# Patient Record
Sex: Female | Born: 1950 | Race: White | Hispanic: No | Marital: Single | Smoking: Current some day smoker
Health system: Southern US, Community
[De-identification: ages and names within clinical notes are randomized; demographics above are authoritative.]

## PROBLEM LIST (undated history)

## (undated) DIAGNOSIS — F419 Anxiety disorder, unspecified: Secondary | ICD-10-CM

## (undated) DIAGNOSIS — N3281 Overactive bladder: Secondary | ICD-10-CM

## (undated) DIAGNOSIS — K219 Gastro-esophageal reflux disease without esophagitis: Secondary | ICD-10-CM

## (undated) DIAGNOSIS — K449 Diaphragmatic hernia without obstruction or gangrene: Secondary | ICD-10-CM

## (undated) DIAGNOSIS — M810 Age-related osteoporosis without current pathological fracture: Secondary | ICD-10-CM

## (undated) DIAGNOSIS — T7840XA Allergy, unspecified, initial encounter: Secondary | ICD-10-CM

## (undated) DIAGNOSIS — T8859XA Other complications of anesthesia, initial encounter: Secondary | ICD-10-CM

## (undated) DIAGNOSIS — F329 Major depressive disorder, single episode, unspecified: Secondary | ICD-10-CM

## (undated) DIAGNOSIS — M1990 Unspecified osteoarthritis, unspecified site: Secondary | ICD-10-CM

## (undated) DIAGNOSIS — R32 Unspecified urinary incontinence: Secondary | ICD-10-CM

## (undated) DIAGNOSIS — F32A Depression, unspecified: Secondary | ICD-10-CM

## (undated) DIAGNOSIS — G2581 Restless legs syndrome: Secondary | ICD-10-CM

## (undated) HISTORY — DX: Diaphragmatic hernia without obstruction or gangrene: K44.9

## (undated) HISTORY — DX: Unspecified urinary incontinence: R32

## (undated) HISTORY — DX: Gastro-esophageal reflux disease without esophagitis: K21.9

## (undated) HISTORY — DX: Anxiety disorder, unspecified: F41.9

## (undated) HISTORY — DX: Depression, unspecified: F32.A

## (undated) HISTORY — DX: Unspecified osteoarthritis, unspecified site: M19.90

## (undated) HISTORY — PX: BREAST BIOPSY: SHX20

## (undated) HISTORY — PX: EYE SURGERY: SHX253

## (undated) HISTORY — DX: Major depressive disorder, single episode, unspecified: F32.9

## (undated) HISTORY — DX: Allergy, unspecified, initial encounter: T78.40XA

## (undated) HISTORY — DX: Age-related osteoporosis without current pathological fracture: M81.0

## (undated) HISTORY — PX: BREAST EXCISIONAL BIOPSY: SUR124

## (undated) HISTORY — PX: CERVICAL BIOPSY: SHX590

## (undated) HISTORY — DX: Restless legs syndrome: G25.81

## (undated) HISTORY — DX: Overactive bladder: N32.81

---

## 1997-02-01 HISTORY — PX: OTHER SURGICAL HISTORY: SHX169

## 2006-09-25 ENCOUNTER — Encounter (INDEPENDENT_AMBULATORY_CARE_PROVIDER_SITE_OTHER): Payer: Self-pay | Admitting: Nurse Practitioner

## 2006-11-13 ENCOUNTER — Ambulatory Visit: Payer: Self-pay | Admitting: Psychiatry

## 2006-11-18 ENCOUNTER — Other Ambulatory Visit (HOSPITAL_COMMUNITY): Admission: RE | Admit: 2006-11-18 | Discharge: 2007-02-16 | Payer: Self-pay | Admitting: Psychiatry

## 2007-06-04 ENCOUNTER — Ambulatory Visit (HOSPITAL_COMMUNITY): Admission: RE | Admit: 2007-06-04 | Discharge: 2007-06-04 | Payer: Self-pay | Admitting: Internal Medicine

## 2007-06-04 ENCOUNTER — Ambulatory Visit: Payer: Self-pay | Admitting: Family Medicine

## 2007-07-01 ENCOUNTER — Ambulatory Visit: Payer: Self-pay | Admitting: Internal Medicine

## 2007-07-07 ENCOUNTER — Ambulatory Visit: Payer: Self-pay | Admitting: Internal Medicine

## 2007-10-11 ENCOUNTER — Encounter: Payer: Self-pay | Admitting: Internal Medicine

## 2007-10-11 ENCOUNTER — Ambulatory Visit: Payer: Self-pay | Admitting: Internal Medicine

## 2007-10-16 ENCOUNTER — Ambulatory Visit (HOSPITAL_COMMUNITY): Admission: RE | Admit: 2007-10-16 | Discharge: 2007-10-16 | Payer: Self-pay | Admitting: Family Medicine

## 2007-10-28 ENCOUNTER — Encounter: Admission: RE | Admit: 2007-10-28 | Discharge: 2007-10-28 | Payer: Self-pay | Admitting: Family Medicine

## 2007-11-05 ENCOUNTER — Ambulatory Visit: Payer: Self-pay | Admitting: Internal Medicine

## 2007-11-05 LAB — CONVERTED CEMR LAB
ALT: 17 units/L (ref 0–35)
AST: 15 units/L (ref 0–37)
BUN: 9 mg/dL (ref 6–23)
Basophils Absolute: 0.1 10*3/uL (ref 0.0–0.1)
Basophils Relative: 1 % (ref 0–1)
Creatinine, Ser: 0.82 mg/dL (ref 0.40–1.20)
Eosinophils Absolute: 0.1 10*3/uL (ref 0.0–0.7)
Eosinophils Relative: 3 % (ref 0–5)
HCT: 44.6 % (ref 36.0–46.0)
HDL: 80 mg/dL (ref >39)
Hemoglobin: 14.3 g/dL (ref 12.0–15.0)
MCHC: 32.1 g/dL (ref 30.0–36.0)
MCV: 92 fL (ref 78.0–100.0)
Monocytes Absolute: 0.3 10*3/uL (ref 0.1–1.0)
RDW: 12.6 % (ref 11.5–15.5)
TSH: 2.798 microintl units/mL (ref 0.350–4.50)
Total Bilirubin: 0.4 mg/dL (ref 0.3–1.2)
Total CHOL/HDL Ratio: 2.7
VLDL: 13 mg/dL (ref 0–40)

## 2007-12-02 ENCOUNTER — Ambulatory Visit: Payer: Self-pay | Admitting: Internal Medicine

## 2008-02-02 HISTORY — PX: BUNIONECTOMY: SHX129

## 2008-07-08 ENCOUNTER — Ambulatory Visit: Payer: Self-pay | Admitting: Internal Medicine

## 2008-11-10 ENCOUNTER — Ambulatory Visit: Payer: Self-pay | Admitting: Internal Medicine

## 2008-11-10 LAB — CONVERTED CEMR LAB
Alkaline Phosphatase: 76 units/L (ref 39–117)
Creatinine, Ser: 0.99 mg/dL (ref 0.40–1.20)
Glucose, Bld: 77 mg/dL (ref 70–99)
Sodium: 138 meq/L (ref 135–145)
Total Bilirubin: 0.3 mg/dL (ref 0.3–1.2)
Total Protein: 6.5 g/dL (ref 6.0–8.3)

## 2008-11-16 ENCOUNTER — Ambulatory Visit (HOSPITAL_COMMUNITY): Admission: RE | Admit: 2008-11-16 | Discharge: 2008-11-16 | Payer: Self-pay | Admitting: Internal Medicine

## 2008-11-29 ENCOUNTER — Encounter: Admission: RE | Admit: 2008-11-29 | Discharge: 2008-11-29 | Payer: Self-pay | Admitting: Internal Medicine

## 2008-12-03 ENCOUNTER — Ambulatory Visit: Payer: Self-pay | Admitting: Internal Medicine

## 2008-12-03 LAB — CONVERTED CEMR LAB
TSH: 4.048 microintl units/mL (ref 0.350–4.500)
Vit D, 25-Hydroxy: 22 ng/mL — ABNORMAL LOW (ref 30–89)

## 2008-12-14 ENCOUNTER — Encounter (INDEPENDENT_AMBULATORY_CARE_PROVIDER_SITE_OTHER): Payer: Self-pay | Admitting: Adult Health

## 2008-12-14 ENCOUNTER — Ambulatory Visit: Payer: Self-pay | Admitting: Internal Medicine

## 2008-12-14 LAB — CONVERTED CEMR LAB
Basophils Absolute: 0.1 10*3/uL (ref 0.0–0.1)
Eosinophils Relative: 2 % (ref 0–5)
HCT: 40.5 % (ref 36.0–46.0)
Hemoglobin: 13.2 g/dL (ref 12.0–15.0)
Lymphocytes Relative: 30 % (ref 12–46)
Lymphs Abs: 1.6 10*3/uL (ref 0.7–4.0)
Monocytes Absolute: 0.5 10*3/uL (ref 0.1–1.0)
Monocytes Relative: 9 % (ref 3–12)
Neutro Abs: 3.1 10*3/uL (ref 1.7–7.7)
RBC: 4.47 M/uL (ref 3.87–5.11)
RDW: 13.3 % (ref 11.5–15.5)

## 2009-01-06 ENCOUNTER — Encounter: Admission: RE | Admit: 2009-01-06 | Discharge: 2009-01-06 | Payer: Self-pay | Admitting: General Surgery

## 2009-01-06 ENCOUNTER — Ambulatory Visit (HOSPITAL_COMMUNITY): Admission: RE | Admit: 2009-01-06 | Discharge: 2009-01-06 | Payer: Self-pay | Admitting: General Surgery

## 2009-05-09 ENCOUNTER — Ambulatory Visit: Payer: Self-pay | Admitting: Internal Medicine

## 2009-11-17 ENCOUNTER — Encounter: Admission: RE | Admit: 2009-11-17 | Discharge: 2009-11-17 | Payer: Self-pay | Admitting: Internal Medicine

## 2010-03-19 LAB — BASIC METABOLIC PANEL
CO2: 27 mEq/L (ref 19–32)
Calcium: 9.1 mg/dL (ref 8.4–10.5)
Chloride: 106 mEq/L (ref 96–112)
GFR calc Af Amer: >60 mL/min (ref >60)
Glucose, Bld: 76 mg/dL (ref 70–99)
Potassium: 4.4 mEq/L (ref 3.5–5.1)
Sodium: 140 mEq/L (ref 135–145)

## 2010-03-19 LAB — CBC
HCT: 34.9 % — ABNORMAL LOW (ref 36.0–46.0)
Hemoglobin: 11.9 g/dL — ABNORMAL LOW (ref 12.0–15.0)
MCHC: 34.2 g/dL (ref 30.0–36.0)
MCV: 90.1 fL (ref 78.0–100.0)
RBC: 3.87 MIL/uL (ref 3.87–5.11)
RDW: 14.1 % (ref 11.5–15.5)

## 2010-03-19 LAB — DIFFERENTIAL
Basophils Absolute: 0.1 10*3/uL (ref 0.0–0.1)
Basophils Relative: 1 % (ref 0–1)
Eosinophils Relative: 2 % (ref 0–5)
Monocytes Absolute: 0.4 10*3/uL (ref 0.1–1.0)
Monocytes Relative: 9 % (ref 3–12)
Neutro Abs: 2.7 10*3/uL (ref 1.7–7.7)

## 2010-05-02 ENCOUNTER — Encounter (INDEPENDENT_AMBULATORY_CARE_PROVIDER_SITE_OTHER): Payer: Self-pay | Admitting: General Surgery

## 2010-06-19 ENCOUNTER — Other Ambulatory Visit: Payer: Self-pay | Admitting: Family Medicine

## 2010-10-11 ENCOUNTER — Other Ambulatory Visit (INDEPENDENT_AMBULATORY_CARE_PROVIDER_SITE_OTHER): Payer: Self-pay | Admitting: General Surgery

## 2010-10-11 DIAGNOSIS — Z1231 Encounter for screening mammogram for malignant neoplasm of breast: Secondary | ICD-10-CM

## 2010-11-06 ENCOUNTER — Ambulatory Visit (INDEPENDENT_AMBULATORY_CARE_PROVIDER_SITE_OTHER): Payer: Self-pay | Admitting: General Surgery

## 2010-11-20 ENCOUNTER — Ambulatory Visit (HOSPITAL_COMMUNITY)
Admission: RE | Admit: 2010-11-20 | Discharge: 2010-11-20 | Disposition: A | Discharge status: Home / Self Care | Payer: Medicare Other | Source: Ambulatory Visit | Attending: General Surgery | Admitting: General Surgery

## 2010-11-20 DIAGNOSIS — Z1231 Encounter for screening mammogram for malignant neoplasm of breast: Secondary | ICD-10-CM | POA: Insufficient documentation

## 2010-11-30 ENCOUNTER — Ambulatory Visit (INDEPENDENT_AMBULATORY_CARE_PROVIDER_SITE_OTHER): Payer: Self-pay | Admitting: General Surgery

## 2010-12-05 ENCOUNTER — Other Ambulatory Visit (HOSPITAL_COMMUNITY): Payer: Self-pay | Admitting: Family Medicine

## 2010-12-05 DIAGNOSIS — Z9223 Personal history of estrogen therapy: Secondary | ICD-10-CM

## 2010-12-05 DIAGNOSIS — Z780 Asymptomatic menopausal state: Secondary | ICD-10-CM

## 2010-12-14 ENCOUNTER — Ambulatory Visit (HOSPITAL_COMMUNITY)
Admission: RE | Admit: 2010-12-14 | Discharge: 2010-12-14 | Disposition: A | Discharge status: Home / Self Care | Payer: Medicare Other | Source: Ambulatory Visit | Attending: Family Medicine | Admitting: Family Medicine

## 2010-12-14 ENCOUNTER — Other Ambulatory Visit (HOSPITAL_COMMUNITY): Payer: Self-pay | Admitting: Family Medicine

## 2010-12-14 DIAGNOSIS — Z09 Encounter for follow-up examination after completed treatment for conditions other than malignant neoplasm: Secondary | ICD-10-CM | POA: Insufficient documentation

## 2010-12-14 DIAGNOSIS — Z9223 Personal history of estrogen therapy: Secondary | ICD-10-CM | POA: Insufficient documentation

## 2010-12-15 ENCOUNTER — Ambulatory Visit (INDEPENDENT_AMBULATORY_CARE_PROVIDER_SITE_OTHER): Payer: Medicare Other | Admitting: General Surgery

## 2010-12-15 DIAGNOSIS — R928 Other abnormal and inconclusive findings on diagnostic imaging of breast: Secondary | ICD-10-CM | POA: Insufficient documentation

## 2010-12-15 NOTE — Patient Instructions (Signed)
Follow up with me if mammograms are abnormal, otherwise, get yearly breast exam from primary care physician.

## 2010-12-15 NOTE — Assessment & Plan Note (Signed)
Follow up breast exam yearly with PCP Self breast exams Yearly mammo Follow up with me if mammo abnormal.
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
## 2010-12-18 ENCOUNTER — Ambulatory Visit (HOSPITAL_COMMUNITY)
Admission: RE | Admit: 2010-12-18 | Discharge: 2010-12-18 | Disposition: A | Discharge status: Home / Self Care | Payer: Medicare Other | Source: Ambulatory Visit | Attending: Family Medicine | Admitting: Family Medicine

## 2010-12-18 DIAGNOSIS — Z13820 Encounter for screening for osteoporosis: Secondary | ICD-10-CM | POA: Insufficient documentation

## 2010-12-18 DIAGNOSIS — Z780 Asymptomatic menopausal state: Secondary | ICD-10-CM

## 2011-10-23 ENCOUNTER — Other Ambulatory Visit (INDEPENDENT_AMBULATORY_CARE_PROVIDER_SITE_OTHER): Payer: Self-pay | Admitting: General Surgery

## 2011-10-23 DIAGNOSIS — Z1231 Encounter for screening mammogram for malignant neoplasm of breast: Secondary | ICD-10-CM

## 2011-11-21 ENCOUNTER — Ambulatory Visit (HOSPITAL_COMMUNITY)
Admission: RE | Admit: 2011-11-21 | Discharge: 2011-11-21 | Disposition: A | Discharge status: Home / Self Care | Payer: Medicare Other | Source: Ambulatory Visit | Attending: General Surgery | Admitting: General Surgery

## 2011-11-21 DIAGNOSIS — Z1231 Encounter for screening mammogram for malignant neoplasm of breast: Secondary | ICD-10-CM

## 2012-10-23 ENCOUNTER — Other Ambulatory Visit (HOSPITAL_COMMUNITY): Payer: Self-pay | Admitting: Nurse Practitioner

## 2012-10-23 DIAGNOSIS — Z1231 Encounter for screening mammogram for malignant neoplasm of breast: Secondary | ICD-10-CM

## 2012-11-24 ENCOUNTER — Ambulatory Visit (HOSPITAL_COMMUNITY)
Admission: RE | Admit: 2012-11-24 | Discharge: 2012-11-24 | Disposition: A | Discharge status: Home / Self Care | Payer: Medicare Other | Source: Ambulatory Visit | Attending: Nurse Practitioner | Admitting: Nurse Practitioner

## 2012-11-24 DIAGNOSIS — Z1231 Encounter for screening mammogram for malignant neoplasm of breast: Secondary | ICD-10-CM | POA: Insufficient documentation

## 2013-10-26 ENCOUNTER — Other Ambulatory Visit (HOSPITAL_COMMUNITY): Payer: Self-pay | Admitting: Nurse Practitioner

## 2013-10-26 DIAGNOSIS — Z1231 Encounter for screening mammogram for malignant neoplasm of breast: Secondary | ICD-10-CM

## 2013-11-25 ENCOUNTER — Ambulatory Visit (HOSPITAL_COMMUNITY)
Admission: RE | Admit: 2013-11-25 | Discharge: 2013-11-25 | Disposition: A | Discharge status: Home / Self Care | Payer: Medicare HMO | Source: Ambulatory Visit | Attending: Nurse Practitioner | Admitting: Nurse Practitioner

## 2013-11-25 DIAGNOSIS — Z1231 Encounter for screening mammogram for malignant neoplasm of breast: Secondary | ICD-10-CM | POA: Insufficient documentation

## 2014-09-28 ENCOUNTER — Emergency Department (HOSPITAL_COMMUNITY)
Admission: EM | Admit: 2014-09-28 | Discharge: 2014-09-28 | Disposition: A | Discharge status: Home / Self Care | Payer: No Typology Code available for payment source | Attending: Emergency Medicine | Admitting: Emergency Medicine

## 2014-09-28 ENCOUNTER — Encounter (HOSPITAL_COMMUNITY): Payer: Self-pay | Admitting: *Deleted

## 2014-09-28 DIAGNOSIS — S60211A Contusion of right wrist, initial encounter: Secondary | ICD-10-CM | POA: Insufficient documentation

## 2014-09-28 DIAGNOSIS — S00431A Contusion of right ear, initial encounter: Secondary | ICD-10-CM | POA: Insufficient documentation

## 2014-09-28 DIAGNOSIS — S5012XA Contusion of left forearm, initial encounter: Secondary | ICD-10-CM | POA: Insufficient documentation

## 2014-09-28 DIAGNOSIS — H9190 Unspecified hearing loss, unspecified ear: Secondary | ICD-10-CM | POA: Diagnosis not present

## 2014-09-28 DIAGNOSIS — Y998 Other external cause status: Secondary | ICD-10-CM | POA: Insufficient documentation

## 2014-09-28 DIAGNOSIS — Z8709 Personal history of other diseases of the respiratory system: Secondary | ICD-10-CM | POA: Diagnosis not present

## 2014-09-28 DIAGNOSIS — Z8719 Personal history of other diseases of the digestive system: Secondary | ICD-10-CM | POA: Insufficient documentation

## 2014-09-28 DIAGNOSIS — T148XXA Other injury of unspecified body region, initial encounter: Secondary | ICD-10-CM

## 2014-09-28 DIAGNOSIS — Y9389 Activity, other specified: Secondary | ICD-10-CM | POA: Diagnosis not present

## 2014-09-28 DIAGNOSIS — Y92009 Unspecified place in unspecified non-institutional (private) residence as the place of occurrence of the external cause: Secondary | ICD-10-CM | POA: Diagnosis not present

## 2014-09-28 DIAGNOSIS — S1081XA Abrasion of other specified part of neck, initial encounter: Secondary | ICD-10-CM | POA: Diagnosis not present

## 2014-09-28 DIAGNOSIS — F329 Major depressive disorder, single episode, unspecified: Secondary | ICD-10-CM | POA: Insufficient documentation

## 2014-09-28 DIAGNOSIS — Z79899 Other long term (current) drug therapy: Secondary | ICD-10-CM | POA: Insufficient documentation

## 2014-09-28 DIAGNOSIS — T7421XA Adult sexual abuse, confirmed, initial encounter: Secondary | ICD-10-CM | POA: Diagnosis not present

## 2014-09-28 DIAGNOSIS — F419 Anxiety disorder, unspecified: Secondary | ICD-10-CM | POA: Insufficient documentation

## 2014-09-28 DIAGNOSIS — S0083XA Contusion of other part of head, initial encounter: Secondary | ICD-10-CM | POA: Diagnosis not present

## 2014-09-28 MED ORDER — ULIPRISTAL ACETATE 30 MG PO TABS
ORAL_TABLET | ORAL | Status: AC
  Filled 2014-09-28: qty 1

## 2014-09-28 MED ORDER — PROMETHAZINE HCL 25 MG PO TABS
ORAL_TABLET | ORAL | Status: AC
  Administered 2014-09-28: 13:00:00
  Filled 2014-09-28: qty 3

## 2014-09-28 MED ORDER — CEFIXIME 400 MG PO CAPS
ORAL_CAPSULE | ORAL | Status: AC
  Administered 2014-09-28: 13:00:00
  Filled 2014-09-28: qty 1

## 2014-09-28 MED ORDER — METRONIDAZOLE 500 MG PO TABS
ORAL_TABLET | ORAL | Status: AC
  Administered 2014-09-28: 13:00:00
  Filled 2014-09-28: qty 4

## 2014-09-28 MED ORDER — AZITHROMYCIN 1 G PO PACK
PACK | ORAL | Status: AC
  Administered 2014-09-28: 13:00:00
  Filled 2014-09-28: qty 1
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
## 2014-09-28 NOTE — ED Notes (Signed)
Pt transferred off unit with sane.  D/C by SANE upstairs.

## 2014-09-28 NOTE — ED Notes (Addendum)
SANE RN at bedside.
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
## 2014-09-28 NOTE — ED Notes (Signed)
SANE nurse at bedside.

## 2014-11-02 ENCOUNTER — Other Ambulatory Visit: Payer: Self-pay

## 2014-11-02 DIAGNOSIS — Z1231 Encounter for screening mammogram for malignant neoplasm of breast: Secondary | ICD-10-CM

## 2014-12-01 ENCOUNTER — Ambulatory Visit
Admission: RE | Admit: 2014-12-01 | Discharge: 2014-12-01 | Disposition: A | Discharge status: Home / Self Care | Payer: Medicare HMO | Source: Ambulatory Visit

## 2014-12-01 DIAGNOSIS — Z1231 Encounter for screening mammogram for malignant neoplasm of breast: Secondary | ICD-10-CM

## 2015-01-05 DIAGNOSIS — F419 Anxiety disorder, unspecified: Secondary | ICD-10-CM | POA: Diagnosis not present

## 2015-02-02 DIAGNOSIS — F419 Anxiety disorder, unspecified: Secondary | ICD-10-CM | POA: Diagnosis not present

## 2015-02-07 DIAGNOSIS — Z21 Asymptomatic human immunodeficiency virus [HIV] infection status: Secondary | ICD-10-CM | POA: Diagnosis not present

## 2015-02-07 DIAGNOSIS — N898 Other specified noninflammatory disorders of vagina: Secondary | ICD-10-CM | POA: Diagnosis not present

## 2015-02-07 DIAGNOSIS — Z0000 Encounter for general adult medical examination without abnormal findings: Secondary | ICD-10-CM | POA: Diagnosis not present

## 2015-02-07 DIAGNOSIS — Z7251 High risk heterosexual behavior: Secondary | ICD-10-CM | POA: Diagnosis not present

## 2015-02-07 DIAGNOSIS — T7421XS Adult sexual abuse, confirmed, sequela: Secondary | ICD-10-CM | POA: Diagnosis not present

## 2015-03-02 DIAGNOSIS — F419 Anxiety disorder, unspecified: Secondary | ICD-10-CM | POA: Diagnosis not present

## 2015-03-08 DIAGNOSIS — F1020 Alcohol dependence, uncomplicated: Secondary | ICD-10-CM | POA: Diagnosis not present

## 2015-03-09 DIAGNOSIS — F419 Anxiety disorder, unspecified: Secondary | ICD-10-CM | POA: Diagnosis not present

## 2015-03-14 DIAGNOSIS — F419 Anxiety disorder, unspecified: Secondary | ICD-10-CM | POA: Diagnosis not present

## 2015-03-21 DIAGNOSIS — F419 Anxiety disorder, unspecified: Secondary | ICD-10-CM | POA: Diagnosis not present

## 2015-04-06 DIAGNOSIS — F419 Anxiety disorder, unspecified: Secondary | ICD-10-CM | POA: Diagnosis not present

## 2015-04-08 DIAGNOSIS — L282 Other prurigo: Secondary | ICD-10-CM | POA: Diagnosis not present

## 2015-04-08 DIAGNOSIS — F419 Anxiety disorder, unspecified: Secondary | ICD-10-CM | POA: Diagnosis not present

## 2015-04-08 DIAGNOSIS — K5900 Constipation, unspecified: Secondary | ICD-10-CM | POA: Diagnosis not present

## 2015-04-08 DIAGNOSIS — R21 Rash and other nonspecific skin eruption: Secondary | ICD-10-CM | POA: Diagnosis not present

## 2015-04-08 DIAGNOSIS — F329 Major depressive disorder, single episode, unspecified: Secondary | ICD-10-CM | POA: Diagnosis not present

## 2015-04-08 DIAGNOSIS — H6120 Impacted cerumen, unspecified ear: Secondary | ICD-10-CM | POA: Diagnosis not present

## 2015-04-25 DIAGNOSIS — F419 Anxiety disorder, unspecified: Secondary | ICD-10-CM | POA: Diagnosis not present

## 2015-05-04 DIAGNOSIS — F419 Anxiety disorder, unspecified: Secondary | ICD-10-CM | POA: Diagnosis not present

## 2015-05-09 DIAGNOSIS — F410 Panic disorder [episodic paroxysmal anxiety] without agoraphobia: Secondary | ICD-10-CM | POA: Diagnosis not present

## 2015-05-11 DIAGNOSIS — F419 Anxiety disorder, unspecified: Secondary | ICD-10-CM | POA: Diagnosis not present

## 2015-05-23 DIAGNOSIS — F419 Anxiety disorder, unspecified: Secondary | ICD-10-CM | POA: Diagnosis not present

## 2015-06-06 DIAGNOSIS — F419 Anxiety disorder, unspecified: Secondary | ICD-10-CM | POA: Diagnosis not present

## 2015-06-13 DIAGNOSIS — F419 Anxiety disorder, unspecified: Secondary | ICD-10-CM | POA: Diagnosis not present

## 2015-07-11 DIAGNOSIS — F419 Anxiety disorder, unspecified: Secondary | ICD-10-CM | POA: Diagnosis not present

## 2015-07-13 DIAGNOSIS — L508 Other urticaria: Secondary | ICD-10-CM | POA: Diagnosis not present

## 2015-08-01 DIAGNOSIS — F419 Anxiety disorder, unspecified: Secondary | ICD-10-CM | POA: Diagnosis not present

## 2015-09-08 DIAGNOSIS — M5431 Sciatica, right side: Secondary | ICD-10-CM | POA: Diagnosis not present

## 2015-09-13 ENCOUNTER — Telehealth (HOSPITAL_COMMUNITY): Payer: Self-pay

## 2015-09-13 NOTE — Telephone Encounter (Signed)
PT called and l/m to schedule a new pt appt with Dr. Sheran Spine. Called pt back and l/m to call his office at [place] as he's only here one day and week and the wait time is extensive. Left the # for [facility] on v/mail

## 2015-09-29 DIAGNOSIS — R5383 Other fatigue: Secondary | ICD-10-CM | POA: Diagnosis not present

## 2015-09-29 DIAGNOSIS — M5431 Sciatica, right side: Secondary | ICD-10-CM | POA: Diagnosis not present

## 2015-10-24 ENCOUNTER — Other Ambulatory Visit: Payer: Self-pay | Admitting: Internal Medicine

## 2015-10-24 DIAGNOSIS — Z1231 Encounter for screening mammogram for malignant neoplasm of breast: Secondary | ICD-10-CM

## 2015-10-31 DIAGNOSIS — F3342 Major depressive disorder, recurrent, in full remission: Secondary | ICD-10-CM | POA: Diagnosis not present

## 2015-11-10 DIAGNOSIS — M5431 Sciatica, right side: Secondary | ICD-10-CM | POA: Diagnosis not present

## 2015-11-10 DIAGNOSIS — R5383 Other fatigue: Secondary | ICD-10-CM | POA: Diagnosis not present

## 2015-11-17 DIAGNOSIS — M1611 Unilateral primary osteoarthritis, right hip: Secondary | ICD-10-CM | POA: Diagnosis not present

## 2015-11-17 DIAGNOSIS — M4186 Other forms of scoliosis, lumbar region: Secondary | ICD-10-CM | POA: Diagnosis not present

## 2015-11-17 DIAGNOSIS — K210 Gastro-esophageal reflux disease with esophagitis: Secondary | ICD-10-CM | POA: Diagnosis not present

## 2015-11-17 DIAGNOSIS — N3281 Overactive bladder: Secondary | ICD-10-CM | POA: Diagnosis not present

## 2015-11-17 DIAGNOSIS — M5431 Sciatica, right side: Secondary | ICD-10-CM | POA: Diagnosis not present

## 2015-11-17 DIAGNOSIS — G2581 Restless legs syndrome: Secondary | ICD-10-CM | POA: Diagnosis not present

## 2015-12-05 ENCOUNTER — Ambulatory Visit
Admission: RE | Admit: 2015-12-05 | Discharge: 2015-12-05 | Disposition: A | Discharge status: Home / Self Care | Payer: PPO | Source: Ambulatory Visit | Attending: Internal Medicine | Admitting: Internal Medicine

## 2015-12-05 DIAGNOSIS — Z1231 Encounter for screening mammogram for malignant neoplasm of breast: Secondary | ICD-10-CM

## 2015-12-29 DIAGNOSIS — F3342 Major depressive disorder, recurrent, in full remission: Secondary | ICD-10-CM | POA: Diagnosis not present

## 2016-01-11 ENCOUNTER — Encounter: Payer: Self-pay | Admitting: Gastroenterology

## 2016-02-16 ENCOUNTER — Ambulatory Visit: Payer: PPO | Admitting: Gastroenterology

## 2016-02-16 DIAGNOSIS — M5431 Sciatica, right side: Secondary | ICD-10-CM | POA: Diagnosis not present

## 2016-02-16 DIAGNOSIS — K210 Gastro-esophageal reflux disease with esophagitis: Secondary | ICD-10-CM | POA: Diagnosis not present

## 2016-02-16 DIAGNOSIS — F3342 Major depressive disorder, recurrent, in full remission: Secondary | ICD-10-CM | POA: Diagnosis not present

## 2016-02-22 ENCOUNTER — Encounter (INDEPENDENT_AMBULATORY_CARE_PROVIDER_SITE_OTHER): Payer: Self-pay

## 2016-02-22 ENCOUNTER — Ambulatory Visit (INDEPENDENT_AMBULATORY_CARE_PROVIDER_SITE_OTHER): Payer: PPO | Admitting: Gastroenterology

## 2016-02-22 ENCOUNTER — Encounter: Payer: Self-pay | Admitting: Gastroenterology

## 2016-02-22 VITALS — BP 112/80 | HR 88 | Ht 62.0 in | Wt 142.2 lb

## 2016-02-22 DIAGNOSIS — Z1211 Encounter for screening for malignant neoplasm of colon: Secondary | ICD-10-CM | POA: Diagnosis not present

## 2016-02-22 DIAGNOSIS — K5900 Constipation, unspecified: Secondary | ICD-10-CM | POA: Diagnosis not present

## 2016-02-22 DIAGNOSIS — K219 Gastro-esophageal reflux disease without esophagitis: Secondary | ICD-10-CM

## 2016-02-22 DIAGNOSIS — Z1212 Encounter for screening for malignant neoplasm of rectum: Secondary | ICD-10-CM

## 2016-02-22 MED ORDER — PLECANATIDE 3 MG PO TABS: 1.0000 | ORAL_TABLET | Freq: Every day | ORAL | 5 refills | Status: DC

## 2016-02-22 MED ORDER — NA SULFATE-K SULFATE-MG SULF 17.5-3.13-1.6 GM/177ML PO SOLN: 1.0000 | Freq: Once | ORAL | 0 refills | Status: AC

## 2016-02-22 NOTE — Patient Instructions (Signed)
Start on Trulance samples taking one tablet by mouth daily. A prescription has been sent to your pharmacy.   Continue your prune juice daily. Also continue Purlax daily.   You have been scheduled for a colonoscopy. Please follow written instructions given to you at your visit today.  Please pick up your prep supplies at the pharmacy within the next 1-3 days. If you use inhalers (even only as needed), please bring them with you on the day of your procedure. Your physician has requested that you go to www.startemmi.com and enter the access code given to you at your visit today. This web site gives a general overview about your procedure. However, you should still follow specific instructions given to you by our office regarding your preparation for the procedure.  Thank you for choosing me and [facility].  Pricilla Riffle. Dagoberto Ligas., MD., Marval Regal

## 2016-02-22 NOTE — Progress Notes (Signed)
History of Present Illness: This is a 66 year old female referred by Merrilee Seashore, MD for the evaluation of chronic constipation and colon cancer screening. Patient relates many years of difficulties with constipation and she states that she has incomplete evacuation. At times will have looser stools when taking several laxatives. She has tried Amitza and Linzess (2 strengths) without success. She has not previously had colonoscopy. She states she has done Hemoccults in the past and they have been negative. She has occasional heartburn occurring about once per month. She notes frequent abdominal bloating, flatulence and abdominal discomfort when constipated. These symptoms are relieved with bowel movements. Denies weight loss, abdominal pain,  diarrhea, change in stool caliber, melena, hematochezia, nausea, vomiting, dysphagia, chest pain.   No Known Allergies Outpatient Medications Prior to Visit  Medication Sig Dispense Refill  . busPIRone (BUSPAR) 10 MG tablet Take 20 mg by mouth 3 (three) times daily.  0  . Calcium Citrate-Vitamin D (CALCIUM + D PO) Take 1 tablet by mouth daily.    Marland Kitchen gabapentin (NEURONTIN) 300 MG capsule Take 300 mg by mouth 3 (three) times daily.    . Glucos-Chond-Hyal Ac-Ca Fructo (MOVE FREE JOINT HEALTH ADVANCE) TABS Take 1 tablet by mouth daily.    Marland Kitchen glucosamine-chondroitin 500-400 MG tablet Take 1 tablet by mouth daily.    Marland Kitchen loratadine (CLARITIN) 10 MG tablet Take 10 mg by mouth daily as needed for allergies.   3  . magnesium oxide (MAG-OX) 400 MG tablet Take 400 mg by mouth every evening.    . Misc Natural Products (OSTEO BI-FLEX JOINT SHIELD PO) Take 1 tablet by mouth daily.    . Multiple Vitamin (MULTIVITAMIN WITH MINERALS) TABS tablet Take 1 tablet by mouth daily.    . QUEtiapine (SEROQUEL) 50 MG tablet Take 25 mg by mouth at bedtime as needed (if waking up in the middle of the night take additional tablet).   2  . tolterodine (DETROL) 1 MG tablet Take 2 mg  by mouth every other day. On even days    . clonazePAM (KLONOPIN) 0.5 MG tablet Take 0.25 mg by mouth at bedtime.  2   No facility-administered medications prior to visit.    Past Medical History:  Diagnosis Date  . Anxiety   . Depression   . GERD (gastroesophageal reflux disease)   . Hearing loss   . Incontinence   . Overactive bladder   . Restless leg syndrome    Past Surgical History:  Procedure Laterality Date  . BUNIONECTOMY  02/2008   left foot. ALSO HAD DISLOCATED TOE & CROOKED TOE  . EYE SURGERY  age 65   straightened both eyes  . thumb surgery  02/1997   right   Social History   Social History  . Marital status: Single    Spouse name: N/A  . Number of children: N/A  . Years of education: N/A   Occupational History  . Retired    Social History Main Topics  . Smoking status: Never Smoker  . Smokeless tobacco: Never Used  . Alcohol use 1.0 oz/week    2 Standard drinks or equivalent per week     Comment: 2/DAY 4 DAYS A WEEK  . Drug use: No  . Sexual activity: Not Asked   Other Topics Concern  . None   Social History Narrative  . None   Family History  Problem Relation Age of Onset  . Arthritis Mother   . Stroke Mother   . Diabetes Father   .  Heart disease Father       Review of Systems: Pertinent positive and negative review of systems were noted in the above HPI section. All other review of systems were otherwise negative.   Physical Exam: General: Well developed, well nourished, no acute distress Head: Normocephalic and atraumatic Eyes:  sclerae anicteric, EOMI Ears: Normal auditory acuity Mouth: No deformity or lesions Neck: Supple, no masses or thyromegaly Lungs: Clear throughout to auscultation Heart: Regular rate and rhythm; no murmurs, rubs or bruits Abdomen: Soft, non tender and non distended. No masses, hepatosplenomegaly or hernias noted. Normal Bowel sounds Rectal: Deferred to colonoscopy Musculoskeletal: Symmetrical with no gross  deformities  Skin: No lesions on visible extremities Pulses:  Normal pulses noted Extremities: No clubbing, cyanosis, edema or deformities noted Neurological: Alert oriented x 4, grossly nonfocal Cervical Nodes:  No significant cervical adenopathy Inguinal Nodes: No significant inguinal adenopathy Psychological:  Alert and cooperative. Normal mood and affect  Assessment and Recommendations:  1. Constipation, chronic. Colorectal cancer screening. Prune juice daily. High fiber diet with at least 8 glasses of water daily. Begin Trulance 3 mg po daily. Miralax bid prn. Schedule colonoscopy. The risks (including bleeding, perforation, infection, missed lesions, medication reactions and possible hospitalization or surgery if complications occur), benefits, and alternatives to colonoscopy with possible biopsy and possible polypectomy were discussed with the patient and they consent to proceed.   2. GERD, mild. Antireflux measures and Tums when necessary. May use Zantac 150 milligrams daily when necessary Tums is not effective.  cc: Merrilee Seashore, MD [address] [place],  [address]

## 2016-02-23 ENCOUNTER — Telehealth: Payer: Self-pay

## 2016-02-23 DIAGNOSIS — R5383 Other fatigue: Secondary | ICD-10-CM | POA: Diagnosis not present

## 2016-02-23 DIAGNOSIS — M5431 Sciatica, right side: Secondary | ICD-10-CM | POA: Diagnosis not present

## 2016-02-23 DIAGNOSIS — K5901 Slow transit constipation: Secondary | ICD-10-CM | POA: Diagnosis not present

## 2016-02-23 DIAGNOSIS — K210 Gastro-esophageal reflux disease with esophagitis: Secondary | ICD-10-CM | POA: Diagnosis not present

## 2016-02-23 NOTE — Telephone Encounter (Signed)
Received fax from [employer] that Trulance is not on patient's formulary. Can we switch patient to alternative medication?

## 2016-02-23 NOTE — Telephone Encounter (Signed)
She has tried and failed Amitiza and Linzess-see note. Please contact Ruby Cola, the Trulance rep, to see if/how we can get approval by appealing to her insurance.

## 2016-02-24 NOTE — Telephone Encounter (Signed)
Faxed prior authorization for insurance company and waiting on a response.

## 2016-02-27 DIAGNOSIS — F3342 Major depressive disorder, recurrent, in full remission: Secondary | ICD-10-CM | POA: Diagnosis not present

## 2016-02-27 NOTE — Telephone Encounter (Signed)
Received fax from [employer] that coverage determination for Trulance has been approved from 02/24/16 until 12/31/2016.

## 2016-02-29 ENCOUNTER — Encounter: Payer: Self-pay | Admitting: Gastroenterology

## 2016-03-05 DIAGNOSIS — F3342 Major depressive disorder, recurrent, in full remission: Secondary | ICD-10-CM | POA: Diagnosis not present

## 2016-03-14 ENCOUNTER — Encounter: Payer: Self-pay | Admitting: Gastroenterology

## 2016-03-14 ENCOUNTER — Ambulatory Visit (AMBULATORY_SURGERY_CENTER): Payer: PPO | Admitting: Gastroenterology

## 2016-03-14 VITALS — BP 118/78 | HR 76 | Temp 97.1°F | Resp 18 | Ht 62.0 in | Wt 142.0 lb

## 2016-03-14 DIAGNOSIS — E669 Obesity, unspecified: Secondary | ICD-10-CM | POA: Diagnosis not present

## 2016-03-14 DIAGNOSIS — Z1211 Encounter for screening for malignant neoplasm of colon: Secondary | ICD-10-CM

## 2016-03-14 DIAGNOSIS — Z1212 Encounter for screening for malignant neoplasm of rectum: Secondary | ICD-10-CM | POA: Diagnosis not present

## 2016-03-14 DIAGNOSIS — F419 Anxiety disorder, unspecified: Secondary | ICD-10-CM | POA: Diagnosis not present

## 2016-03-14 DIAGNOSIS — K5900 Constipation, unspecified: Secondary | ICD-10-CM | POA: Diagnosis not present

## 2016-03-14 MED ORDER — SODIUM CHLORIDE 0.9 % IV SOLN: 500.0000 mL | INTRAVENOUS | Status: DC

## 2016-03-14 NOTE — Op Note (Signed)
[place] Patient Name: Kathleen Vasquez Procedure Date: 03/14/2016 1:01 PM MRN: [id number] Endoscopist: Ladene Artist , MD Age: 65 Referring MD:  Date of Birth: [birth year] Gender: Female Account #: [id number] Procedure:                Colonoscopy Indications:              Screening for colorectal malignant neoplasm Medicines:                Monitored Anesthesia Care Procedure:                Pre-Anesthesia Assessment:                           - Prior to the procedure, a History and Physical                            was performed, and patient medications and                            allergies were reviewed. The patient's tolerance of                            previous anesthesia was also reviewed. The risks                            and benefits of the procedure and the sedation                            options and risks were discussed with the patient.                            All questions were answered, and informed consent                            was obtained. Prior Anticoagulants: The patient has                            taken no previous anticoagulant or antiplatelet                            agents. ASA Grade Assessment: II - A patient with                            mild systemic disease. After reviewing the risks                            and benefits, the patient was deemed in                            satisfactory condition to undergo the procedure.                           After obtaining informed consent, the colonoscope  was passed under direct vision. Throughout the                            procedure, the patient's blood pressure, pulse, and                            oxygen saturations were monitored continuously. The                            Model [id number]-[id number] scope was introduced                            through the anus and advanced to the the cecum,                            identified by  appendiceal orifice and ileocecal                            valve. The ileocecal valve, appendiceal orifice,                            and rectum were photographed. The quality of the                            bowel preparation was good. The colonoscopy was                            performed without difficulty. The patient tolerated                            the procedure well. Scope In: 1:23:55 PM Scope Out: 1:36:48 PM Scope Withdrawal Time: 0 hours 9 minutes 13 seconds  Total Procedure Duration: 0 hours 12 minutes 53 seconds  Findings:                 The perianal and digital rectal examinations were                            normal.                           A few small-mouthed diverticula were found in the                            sigmoid colon. There was no evidence of                            diverticular bleeding.                           The exam was otherwise without abnormality on                            direct and retroflexion views. Complications:            No immediate complications. Estimated blood  loss:                            None. Estimated Blood Loss:     Estimated blood loss: none. Impression:               - Mild diverticulosis in the sigmoid colon. There                            was no evidence of diverticular bleeding.                           - The examination was otherwise normal on direct                            and retroflexion views.                           - No specimens collected. Recommendation:           - Repeat colonoscopy in 10 years for screening                            purposes.                           - Patient has a contact number available for                            emergencies. The signs and symptoms of potential                            delayed complications were discussed with the                            patient. Return to normal activities tomorrow.                            Written discharge  instructions were provided to the                            patient.                           - Resume previous diet.                           - Continue present medications. Ladene Artist, MD 03/14/2016 1:39:09 PM This report has been signed electronically.

## 2016-03-14 NOTE — Progress Notes (Signed)
Report to PACU, RN, vss, BBS= Clear.  

## 2016-03-14 NOTE — Progress Notes (Signed)
No problems noted in the recovery room. maw 

## 2016-03-14 NOTE — Progress Notes (Signed)
No home oxygen used or hx of sleep apnea

## 2016-03-14 NOTE — Patient Instructions (Signed)
YOU HAD AN ENDOSCOPIC PROCEDURE TODAY AT [facility]:   Refer to the procedure report that was given to you for any specific questions about what was found during the examination.  If the procedure report does not answer your questions, please call your gastroenterologist to clarify.  If you requested that your care partner not be given the details of your procedure findings, then the procedure report has been included in a sealed envelope for you to review at your convenience later.  YOU SHOULD EXPECT: Some feelings of bloating in the abdomen. Passage of more gas than usual.  Walking can help get rid of the air that was put into your GI tract during the procedure and reduce the bloating. If you had a lower endoscopy (such as a colonoscopy or flexible sigmoidoscopy) you may notice spotting of blood in your stool or on the toilet paper. If you underwent a bowel prep for your procedure, you may not have a normal bowel movement for a few days.  Please Note:  You might notice some irritation and congestion in your nose or some drainage.  This is from the oxygen used during your procedure.  There is no need for concern and it should clear up in a day or so.  SYMPTOMS TO REPORT IMMEDIATELY:   Following lower endoscopy (colonoscopy or flexible sigmoidoscopy):  Excessive amounts of blood in the stool  Significant tenderness or worsening of abdominal pains  Swelling of the abdomen that is new, acute  Fever of 100F or higher   For urgent or emergent issues, a gastroenterologist can be reached at any hour by calling [phone].   DIET:  We do recommend a small meal at first, but then you may proceed to your regular diet.  Drink plenty of fluids but you should avoid alcoholic beverages for 24 hours.  ACTIVITY:  You should plan to take it easy for the rest of today and you should NOT DRIVE or use heavy machinery until tomorrow (because of the sedation medicines used during the test).     FOLLOW UP: Our staff will call the number listed on your records the next business day following your procedure to check on you and address any questions or concerns that you may have regarding the information given to you following your procedure. If we do not reach you, we will leave a message.  However, if you are feeling well and you are not experiencing any problems, there is no need to return our call.  We will assume that you have returned to your regular daily activities without incident.  If any biopsies were taken you will be contacted by phone or by letter within the next 1-3 weeks.  Please call us at [phone] if you have not heard about the biopsies in 3 weeks.    SIGNATURES/CONFIDENTIALITY: You and/or your care partner have signed paperwork which will be entered into your electronic medical record.  These signatures attest to the fact that that the information above on your After Visit Summary has been reviewed and is understood.  Full responsibility of the confidentiality of this discharge information lies with you and/or your care-partner.    Handouts were given to your care partner on diverticulosis and a high fiber diet with liberal fluid intake. You may resume your current medications today. Please call if any questions or concerns.

## 2016-03-15 ENCOUNTER — Telehealth: Payer: Self-pay | Admitting: *Deleted

## 2016-03-15 NOTE — Telephone Encounter (Signed)
  Follow up Call-  Call back number 03/14/2016  Post procedure Call Back phone  # [phone]  Permission to leave phone message Yes  Some recent data might be hidden     Patient questions:  Do you have a fever, pain , or abdominal swelling? No. Pain Score  0 *  Have you tolerated food without any problems? Yes.    Have you been able to return to your normal activities? Yes.    Do you have any questions about your discharge instructions: Diet   No. Medications  No. Follow up visit  No.  Do you have questions or concerns about your Care? No.  Actions: * If pain score is 4 or above: No action needed, pain <4.

## 2016-06-04 DIAGNOSIS — F3342 Major depressive disorder, recurrent, in full remission: Secondary | ICD-10-CM | POA: Diagnosis not present

## 2016-06-21 ENCOUNTER — Telehealth: Payer: Self-pay | Admitting: Gastroenterology

## 2016-06-21 NOTE — Telephone Encounter (Signed)
Patient would like to come in and discuss her worsening reflux with Dr. Fuller Plan.  She has changed from constipation to now loose stools.  She is scheduled for an appt on 07/25/16

## 2016-07-02 DIAGNOSIS — H43813 Vitreous degeneration, bilateral: Secondary | ICD-10-CM | POA: Diagnosis not present

## 2016-07-02 DIAGNOSIS — H4423 Degenerative myopia, bilateral: Secondary | ICD-10-CM | POA: Diagnosis not present

## 2016-07-03 DIAGNOSIS — B880 Other acariasis: Secondary | ICD-10-CM | POA: Diagnosis not present

## 2016-07-23 DIAGNOSIS — F3342 Major depressive disorder, recurrent, in full remission: Secondary | ICD-10-CM | POA: Diagnosis not present

## 2016-07-25 ENCOUNTER — Encounter (INDEPENDENT_AMBULATORY_CARE_PROVIDER_SITE_OTHER): Payer: Self-pay

## 2016-07-25 ENCOUNTER — Ambulatory Visit (INDEPENDENT_AMBULATORY_CARE_PROVIDER_SITE_OTHER): Payer: PPO | Admitting: Gastroenterology

## 2016-07-25 ENCOUNTER — Encounter: Payer: Self-pay | Admitting: Gastroenterology

## 2016-07-25 VITALS — BP 100/80 | HR 76 | Ht 61.5 in | Wt 146.1 lb

## 2016-07-25 DIAGNOSIS — K219 Gastro-esophageal reflux disease without esophagitis: Secondary | ICD-10-CM | POA: Diagnosis not present

## 2016-07-25 DIAGNOSIS — R1310 Dysphagia, unspecified: Secondary | ICD-10-CM

## 2016-07-25 MED ORDER — RANITIDINE HCL 300 MG PO CAPS
300.0000 mg | ORAL_CAPSULE | Freq: Every evening | ORAL | 11 refills | Status: DC
Start: 2016-07-25 — End: 2016-09-19

## 2016-07-25 NOTE — Progress Notes (Signed)
    History of Present Illness: This is a 66 year old female complaining of worsening problems with reflux and a few episodes of dysphagia. She states she has had 2 or 3 episodes over the past 6-8 months where it has been difficult to swallow a large pill or solid foods such as meat. Symptoms have been sporadic and do not appear to be increasing in frequency or severity. She has frequent nocturnal regurgitation and reflux symptoms. She is been taking Tums when necessary.   Colonoscopy 03/2016 - Mild diverticulosis in the sigmoid colon. There was no evidence of diverticular bleeding. - The examination was otherwise normal on direct and retroflexion views. - No specimens collected.  Current Medications, Allergies, Past Medical History, Past Surgical History, Family History and Social History were reviewed in [employer] record.  Physical Exam: General: Well developed, well nourished, no acute distress Head: Normocephalic and atraumatic Eyes:  sclerae anicteric, EOMI Ears: Normal auditory acuity Mouth: No deformity or lesions Lungs: Clear throughout to auscultation Heart: Regular rate and rhythm; no murmurs, rubs or bruits Abdomen: Soft, non tender and non distended. No masses, hepatosplenomegaly or hernias noted. Normal Bowel sounds Musculoskeletal: Symmetrical with no gross deformities  Pulses:  Normal pulses noted Extremities: No clubbing, cyanosis, edema or deformities noted Neurological: Alert oriented x 4, grossly nonfocal Psychological:  Alert and cooperative. Normal mood and affect  Assessment and Recommendations:  1. GERD and dysphagia. Follow standard antireflux measures. Begin the use of 4 inch bed blocks. Begin ranitidine 300 mg every evening. Continue Tums when necessary. Schedule barium esophagram. Schedule EGD. The risks (including bleeding, perforation, infection, missed lesions, medication reactions and possible hospitalization or surgery if  complications occur), benefits, and alternatives to endoscopy with possible biopsy and possible dilation were discussed with the patient and they consent to proceed.

## 2016-07-25 NOTE — Patient Instructions (Addendum)
You have been scheduled for an endoscopy. Please follow written instructions given to you at your visit today. If you use inhalers (even only as needed), please bring them with you on the day of your procedure. Your physician has requested that you go to www.startemmi.com and enter the access code given to you at your visit today. This web site gives a general overview about your procedure. However, you should still follow specific instructions given to you by our office regarding your preparation for the procedure.  ________________________________________________________________________   You have been scheduled for a Barium Esophogram at [facility] (1st floor of the hospital) on 08-13-16 at 10:30am. Please arrive 15 minutes prior to your appointment for registration. Make certain not to have anything to eat or drink 6 hours prior to your test. If you need to reschedule for any reason, please contact radiology at [phone] to do so. __________________________________________________________________ A barium swallow is an examination that concentrates on views of the esophagus. This tends to be a double contrast exam (barium and two liquids which, when combined, create a gas to distend the wall of the oesophagus) or single contrast (non-ionic iodine based). The study is usually tailored to your symptoms so a good history is essential. Attention is paid during the study to the form, structure and configuration of the esophagus, looking for functional disorders (such as aspiration, dysphagia, achalasia, motility and reflux) EXAMINATION You may be asked to change into a gown, depending on the type of swallow being performed. A radiologist and radiographer will perform the procedure. The radiologist will advise you of the type of contrast selected for your procedure and direct you during the exam. You will be asked to stand, sit or lie in several different positions and to hold a small amount  of fluid in your mouth before being asked to swallow while the imaging is performed .In some instances you may be asked to swallow barium coated marshmallows to assess the motility of a solid food bolus. The exam can be recorded as a digital or video fluoroscopy procedure. POST PROCEDURE It will take 1-2 days for the barium to pass through your system. To facilitate this, it is important, unless otherwise directed, to increase your fluids for the next 24-48hrs and to resume your normal diet.  This test typically takes about 30 minutes to perform. ____________________________________________________________________   We have sent the following medications to your pharmacy for you to pick up at your convenience: Ranitidine  Patient advised to avoid spicy, acidic, citrus, chocolate, mints, fruit and fruit juices.  Limit the intake of caffeine, alcohol and Soda.  Don't exercise too soon after eating.  Don't lie down within 3-4 hours of eating.  Elevate the head of your bed with 4 inch bed blocks.    Normal BMI (Body Mass Index- based on height and weight) is between 23 and 30. Your BMI today is Body mass index is 27.16 kg/m. Marland Kitchen Please consider follow up  regarding your BMI with your Primary Care Provider.  Thank you for choosing me and [facility].  Pricilla Riffle. Dagoberto Ligas., MD., Marval Regal

## 2016-08-07 DIAGNOSIS — Z780 Asymptomatic menopausal state: Secondary | ICD-10-CM | POA: Diagnosis not present

## 2016-08-07 DIAGNOSIS — M5431 Sciatica, right side: Secondary | ICD-10-CM | POA: Diagnosis not present

## 2016-08-07 DIAGNOSIS — Z0000 Encounter for general adult medical examination without abnormal findings: Secondary | ICD-10-CM | POA: Diagnosis not present

## 2016-08-07 DIAGNOSIS — R5383 Other fatigue: Secondary | ICD-10-CM | POA: Diagnosis not present

## 2016-08-13 ENCOUNTER — Ambulatory Visit (HOSPITAL_COMMUNITY)
Admission: RE | Admit: 2016-08-13 | Discharge: 2016-08-13 | Disposition: A | Discharge status: Home / Self Care | Payer: PPO | Source: Ambulatory Visit | Attending: Gastroenterology | Admitting: Gastroenterology

## 2016-08-13 DIAGNOSIS — K449 Diaphragmatic hernia without obstruction or gangrene: Secondary | ICD-10-CM | POA: Insufficient documentation

## 2016-08-13 DIAGNOSIS — R1310 Dysphagia, unspecified: Secondary | ICD-10-CM | POA: Diagnosis not present

## 2016-08-13 DIAGNOSIS — K219 Gastro-esophageal reflux disease without esophagitis: Secondary | ICD-10-CM

## 2016-08-14 DIAGNOSIS — R5383 Other fatigue: Secondary | ICD-10-CM | POA: Diagnosis not present

## 2016-08-14 DIAGNOSIS — R002 Palpitations: Secondary | ICD-10-CM | POA: Diagnosis not present

## 2016-08-14 DIAGNOSIS — K210 Gastro-esophageal reflux disease with esophagitis: Secondary | ICD-10-CM | POA: Diagnosis not present

## 2016-08-14 DIAGNOSIS — K222 Esophageal obstruction: Secondary | ICD-10-CM | POA: Diagnosis not present

## 2016-08-14 DIAGNOSIS — M810 Age-related osteoporosis without current pathological fracture: Secondary | ICD-10-CM | POA: Diagnosis not present

## 2016-08-14 DIAGNOSIS — Z23 Encounter for immunization: Secondary | ICD-10-CM | POA: Diagnosis not present

## 2016-08-14 DIAGNOSIS — K449 Diaphragmatic hernia without obstruction or gangrene: Secondary | ICD-10-CM | POA: Diagnosis not present

## 2016-09-05 ENCOUNTER — Encounter: Payer: Self-pay | Admitting: Gastroenterology

## 2016-09-06 DIAGNOSIS — F3342 Major depressive disorder, recurrent, in full remission: Secondary | ICD-10-CM | POA: Diagnosis not present

## 2016-09-19 ENCOUNTER — Ambulatory Visit (AMBULATORY_SURGERY_CENTER): Payer: PPO | Admitting: Gastroenterology

## 2016-09-19 ENCOUNTER — Encounter: Payer: Self-pay | Admitting: *Deleted

## 2016-09-19 ENCOUNTER — Encounter: Payer: Self-pay | Admitting: Gastroenterology

## 2016-09-19 VITALS — BP 125/84 | HR 88 | Temp 98.0°F | Resp 22 | Ht 61.0 in | Wt 146.0 lb

## 2016-09-19 DIAGNOSIS — K220 Achalasia of cardia: Secondary | ICD-10-CM | POA: Diagnosis not present

## 2016-09-19 DIAGNOSIS — R1310 Dysphagia, unspecified: Secondary | ICD-10-CM | POA: Diagnosis not present

## 2016-09-19 DIAGNOSIS — K222 Esophageal obstruction: Secondary | ICD-10-CM | POA: Diagnosis not present

## 2016-09-19 DIAGNOSIS — K449 Diaphragmatic hernia without obstruction or gangrene: Secondary | ICD-10-CM | POA: Diagnosis not present

## 2016-09-19 DIAGNOSIS — K2100 Gastro-esophageal reflux disease with esophagitis, without bleeding: Secondary | ICD-10-CM

## 2016-09-19 DIAGNOSIS — R1319 Other dysphagia: Secondary | ICD-10-CM

## 2016-09-19 DIAGNOSIS — R933 Abnormal findings on diagnostic imaging of other parts of digestive tract: Secondary | ICD-10-CM | POA: Diagnosis not present

## 2016-09-19 DIAGNOSIS — K219 Gastro-esophageal reflux disease without esophagitis: Secondary | ICD-10-CM | POA: Diagnosis not present

## 2016-09-19 MED ORDER — PANTOPRAZOLE SODIUM 40 MG PO TBEC: DELAYED_RELEASE_TABLET | ORAL | 3 refills | Status: DC

## 2016-09-19 MED ORDER — SODIUM CHLORIDE 0.9 % IV SOLN: 500.0000 mL | INTRAVENOUS | Status: DC

## 2016-09-19 NOTE — Progress Notes (Signed)
Pt's states no medical or surgical changes since previsit or office visit. 

## 2016-09-19 NOTE — Op Note (Signed)
[facility] Patient Name: Kathleen Vasquez Procedure Date: 09/19/2016 10:04 AM MRN: [id number] Endoscopist: Ladene Artist , MD Age: 65 Referring MD:  Date of Birth: February 26, 1950 Gender: Female Account #: [id number] Procedure:                Upper GI endoscopy Indications:              Dysphagia, Gastro-esophageal reflux disease,                            Abnormal cine-esophagram Medicines:                Monitored Anesthesia Care Procedure:                Pre-Anesthesia Assessment:                           - Prior to the procedure, a History and Physical                            was performed, and patient medications and                            allergies were reviewed. The patient's tolerance of                            previous anesthesia was also reviewed. The risks                            and benefits of the procedure and the sedation                            options and risks were discussed with the patient.                            All questions were answered, and informed consent                            was obtained. Prior Anticoagulants: The patient has                            taken no previous anticoagulant or antiplatelet                            agents. ASA Grade Assessment: II - A patient with                            mild systemic disease. After reviewing the risks                            and benefits, the patient was deemed in                            satisfactory condition to undergo the procedure.  After obtaining informed consent, the endoscope was                            passed under direct vision. Throughout the                            procedure, the patient's blood pressure, pulse, and                            oxygen saturations were monitored continuously. The                            Model GIF-HQ190 [id number]) scope was introduced                            through the mouth, and advanced  to the second part                            of duodenum. The upper GI endoscopy was                            accomplished without difficulty. The patient                            tolerated the procedure well. Scope In: Scope Out: Findings:                 LA Grade C (one or more mucosal breaks continuous                            between tops of 2 or more mucosal folds, less than                            75% circumference) esophagitis with bleeding was                            found in the distal esophagus.                           One moderate benign-appearing, intrinsic stenosis                            was found 32 cm from the incisors. This measured                            1.1 cm (inner diameter) and was traversed. A                            guidewire was placed and the scope was withdrawn.                            Dilations were performed with Savary dilators with  mild resistance at 13 mm, 14 mm, 15 mm. Estimated                            blood loss was minimal.                           The exam of the esophagus was otherwise normal.                           A small hiatal hernia was present.                           The exam of the stomach was otherwise normal.                           The duodenal bulb and second portion of the                            duodenum were normal. Complications:            No immediate complications. Estimated Blood Loss:     Estimated blood loss was minimal. Impression:               - LA Grade C reflux esophagitis.                           - Benign-appearing esophageal stenosis. Dilated.                           - Small hiatal hernia.                           - Normal duodenal bulb and second portion of the                            duodenum.                           - No specimens collected. Recommendation:           - Patient has a contact number available for                             emergencies. The signs and symptoms of potential                            delayed complications were discussed with the                            patient. Return to normal activities tomorrow.                            Written discharge instructions were provided to the                            patient.                           -  Clear liquid diet for 2 hours, then advance as                            tolerated to soft diet today. Advance to prior diet                            as tolatered.                           - Antireflux measures long term.                           - Continue present medications.                           - Protonix (pantoprazole) 40 mg PO BID for 2                            months, then pantoprazole 40 mg qam long term.                           - DC Zantac                           - Repeat EGD in 2-3 months Ladene Artist, MD 09/19/2016 10:35:11 AM This report has been signed electronically.

## 2016-09-19 NOTE — Patient Instructions (Addendum)
Impression/recommendations:  Hiatal Hernia (handout given) Stricture (handout given) Dilation diet (handout given)  Pantoprazole 40 mg twice per day for 2 months and then each morning for long term use.  Repeat Endoscopy scheduled for   YOU HAD AN ENDOSCOPIC PROCEDURE TODAY AT [facility]:   Refer to the procedure report that was given to you for any specific questions about what was found during the examination.  If the procedure report does not answer your questions, please call your gastroenterologist to clarify.  If you requested that your care partner not be given the details of your procedure findings, then the procedure report has been included in a sealed envelope for you to review at your convenience later.  YOU SHOULD EXPECT: Some feelings of bloating in the abdomen. Passage of more gas than usual.  Walking can help get rid of the air that was put into your GI tract during the procedure and reduce the bloating. If you had a lower endoscopy (such as a colonoscopy or flexible sigmoidoscopy) you may notice spotting of blood in your stool or on the toilet paper. If you underwent a bowel prep for your procedure, you may not have a normal bowel movement for a few days.  Please Note:  You might notice some irritation and congestion in your nose or some drainage.  This is from the oxygen used during your procedure.  There is no need for concern and it should clear up in a day or so.  SYMPTOMS TO REPORT IMMEDIATELY:   Following upper endoscopy (EGD)  Vomiting of blood or coffee ground material  New chest pain or pain under the shoulder blades  Painful or persistently difficult swallowing  New shortness of breath  Fever of 100F or higher  Black, tarry-looking stools  For urgent or emergent issues, a gastroenterologist can be reached at any hour by calling [phone].   DIET:  We do recommend a small meal at first, but then you may proceed to your regular diet.   Drink plenty of fluids but you should avoid alcoholic beverages for 24 hours.  ACTIVITY:  You should plan to take it easy for the rest of today and you should NOT DRIVE or use heavy machinery until tomorrow (because of the sedation medicines used during the test).    FOLLOW UP: Our staff will call the number listed on your records the next business day following your procedure to check on you and address any questions or concerns that you may have regarding the information given to you following your procedure. If we do not reach you, we will leave a message.  However, if you are feeling well and you are not experiencing any problems, there is no need to return our call.  We will assume that you have returned to your regular daily activities without incident.  If any biopsies were taken you will be contacted by phone or by letter within the next 1-3 weeks.  Please call us at [phone] if you have not heard about the biopsies in 3 weeks.    SIGNATURES/CONFIDENTIALITY: You and/or your care partner have signed paperwork which will be entered into your electronic medical record.  These signatures attest to the fact that that the information above on your After Visit Summary has been reviewed and is understood.  Full responsibility of the confidentiality of this discharge information lies with you and/or your care-partner.

## 2016-09-19 NOTE — Progress Notes (Signed)
Report given to PACU, vss 

## 2016-09-19 NOTE — Progress Notes (Signed)
Called to room to assist during endoscopic procedure.  Patient ID and intended procedure confirmed with present staff. Received instructions for my participation in the procedure from the performing physician.  

## 2016-09-20 ENCOUNTER — Telehealth: Payer: Self-pay

## 2016-09-20 ENCOUNTER — Telehealth: Payer: Self-pay | Admitting: Gastroenterology

## 2016-09-20 NOTE — Telephone Encounter (Signed)
Left message on answering machine. 

## 2016-09-20 NOTE — Telephone Encounter (Signed)
All questions answered.  She spoke with the [facility] staff earlier and addressed some of her other questions.  She will call back if she has any additional questions or concerns.

## 2016-09-20 NOTE — Telephone Encounter (Signed)
  Follow up Call-  Call back number 09/19/2016 03/14/2016  Post procedure Call Back phone  # [phone]  Permission to leave phone message Yes Yes  Some recent data might be hidden     Patient questions:  Do you have a fever, pain , or abdominal swelling? No. Pain Score  0 *  Have you tolerated food without any problems? Yes.    Have you been able to return to your normal activities? Yes.    Do you have any questions about your discharge instructions: Diet   No. Medications  No. Follow up visit  No.  Do you have questions or concerns about your Care? No.  Actions: * If pain score is 4 or above: No action needed, pain <4.

## 2016-10-30 ENCOUNTER — Other Ambulatory Visit: Payer: Self-pay | Admitting: Internal Medicine

## 2016-10-30 DIAGNOSIS — Z1231 Encounter for screening mammogram for malignant neoplasm of breast: Secondary | ICD-10-CM

## 2016-11-07 ENCOUNTER — Encounter: Payer: Self-pay | Admitting: Gastroenterology

## 2016-11-14 DIAGNOSIS — M810 Age-related osteoporosis without current pathological fracture: Secondary | ICD-10-CM | POA: Diagnosis not present

## 2016-11-19 ENCOUNTER — Ambulatory Visit (AMBULATORY_SURGERY_CENTER): Payer: PPO | Admitting: Gastroenterology

## 2016-11-19 ENCOUNTER — Encounter: Payer: Self-pay | Admitting: Gastroenterology

## 2016-11-19 VITALS — BP 125/84 | HR 96 | Temp 97.8°F | Resp 22 | Ht 61.5 in | Wt 146.0 lb

## 2016-11-19 DIAGNOSIS — K209 Esophagitis, unspecified without bleeding: Secondary | ICD-10-CM

## 2016-11-19 DIAGNOSIS — K229 Disease of esophagus, unspecified: Secondary | ICD-10-CM

## 2016-11-19 DIAGNOSIS — K222 Esophageal obstruction: Secondary | ICD-10-CM | POA: Diagnosis not present

## 2016-11-19 DIAGNOSIS — R933 Abnormal findings on diagnostic imaging of other parts of digestive tract: Secondary | ICD-10-CM

## 2016-11-19 DIAGNOSIS — K2100 Gastro-esophageal reflux disease with esophagitis, without bleeding: Secondary | ICD-10-CM

## 2016-11-19 DIAGNOSIS — K317 Polyp of stomach and duodenum: Secondary | ICD-10-CM | POA: Diagnosis not present

## 2016-11-19 DIAGNOSIS — R1310 Dysphagia, unspecified: Secondary | ICD-10-CM

## 2016-11-19 DIAGNOSIS — K219 Gastro-esophageal reflux disease without esophagitis: Secondary | ICD-10-CM | POA: Diagnosis not present

## 2016-11-19 DIAGNOSIS — F419 Anxiety disorder, unspecified: Secondary | ICD-10-CM | POA: Diagnosis not present

## 2016-11-19 DIAGNOSIS — K2270 Barrett's esophagus without dysplasia: Secondary | ICD-10-CM | POA: Diagnosis not present

## 2016-11-19 DIAGNOSIS — R1319 Other dysphagia: Secondary | ICD-10-CM

## 2016-11-19 MED ORDER — SODIUM CHLORIDE 0.9 % IV SOLN: 500.0000 mL | INTRAVENOUS | Status: DC

## 2016-11-19 MED ORDER — PANTOPRAZOLE SODIUM 40 MG PO TBEC: DELAYED_RELEASE_TABLET | ORAL | 11 refills | Status: DC

## 2016-11-19 NOTE — Patient Instructions (Signed)
YOU HAD AN ENDOSCOPIC PROCEDURE TODAY AT [facility]:   Refer to the procedure report that was given to you for any specific questions about what was found during the examination.  If the procedure report does not answer your questions, please call your gastroenterologist to clarify.  If you requested that your care partner not be given the details of your procedure findings, then the procedure report has been included in a sealed envelope for you to review at your convenience later.  YOU SHOULD EXPECT: Some feelings of bloating in the abdomen. Passage of more gas than usual.  Walking can help get rid of the air that was put into your GI tract during the procedure and reduce the bloating. If you had a lower endoscopy (such as a colonoscopy or flexible sigmoidoscopy) you may notice spotting of blood in your stool or on the toilet paper. If you underwent a bowel prep for your procedure, you may not have a normal bowel movement for a few days.  Please Note:  You might notice some irritation and congestion in your nose or some drainage.  This is from the oxygen used during your procedure.  There is no need for concern and it should clear up in a day or so.  SYMPTOMS TO REPORT IMMEDIATELY:    Following upper endoscopy (EGD)  Vomiting of blood or coffee ground material  New chest pain or pain under the shoulder blades  Painful or persistently difficult swallowing  New shortness of breath  Fever of 100F or higher  Black, tarry-looking stools  For urgent or emergent issues, a gastroenterologist can be reached at any hour by calling [phone].   DIET:  We do recommend a small meal at first, but then you may proceed to your regular diet.  Drink plenty of fluids but you should avoid alcoholic beverages for 24 hours.  ACTIVITY:  You should plan to take it easy for the rest of today and you should NOT DRIVE or use heavy machinery until tomorrow (because of the sedation medicines used  during the test).    FOLLOW UP: Our staff will call the number listed on your records the next business day following your procedure to check on you and address any questions or concerns that you may have regarding the information given to you following your procedure. If we do not reach you, we will leave a message.  However, if you are feeling well and you are not experiencing any problems, there is no need to return our call.  We will assume that you have returned to your regular daily activities without incident.  If any biopsies were taken you will be contacted by phone or by letter within the next 1-3 weeks.  Please call us at [phone] if you have not heard about the biopsies in 3 weeks.    SIGNATURES/CONFIDENTIALITY: You and/or your care partner have signed paperwork which will be entered into your electronic medical record.  These signatures attest to the fact that that the information above on your After Visit Summary has been reviewed and is understood.  Full responsibility of the confidentiality of this discharge information lies with you and/or your care-partner.  Continue present medications Clear liquids for 2 hours, advance as tolerated to soft diet. See Dr. Fuller Plan in one month.  Esophagitis, stenosis, hiatal hernis information given.

## 2016-11-19 NOTE — Op Note (Signed)
[facility] Patient Name: Kathleen Vasquez Procedure Date: 11/19/2016 10:07 AM MRN: [id number] Endoscopist: Ladene Artist , MD Age: 66 Referring MD:  Date of Birth: 1950-01-23 Gender: Female Account #: [id number] Procedure:                Upper GI endoscopy Indications:              Dysphagia Medicines:                Monitored Anesthesia Care Procedure:                Pre-Anesthesia Assessment:                           - Prior to the procedure, a History and Physical                            was performed, and patient medications and                            allergies were reviewed. The patient's tolerance of                            previous anesthesia was also reviewed. The risks                            and benefits of the procedure and the sedation                            options and risks were discussed with the patient.                            All questions were answered, and informed consent                            was obtained. Prior Anticoagulants: The patient has                            taken no previous anticoagulant or antiplatelet                            agents. ASA Grade Assessment: II - A patient with                            mild systemic disease. After reviewing the risks                            and benefits, the patient was deemed in                            satisfactory condition to undergo the procedure.                           After obtaining informed consent, the endoscope was  passed under direct vision. Throughout the                            procedure, the patient's blood pressure, pulse, and                            oxygen saturations were monitored continuously. The                            Endoscope was introduced through the mouth, and                            advanced to the second part of duodenum. The upper                            GI endoscopy was accomplished without  difficulty.                            The patient tolerated the procedure well. Scope In: Scope Out: Findings:                 One moderate benign-appearing, intrinsic stenosis                            was found 32 cm from the incisors. This measured                            1.2 cm (inner diameter) and was traversed. A                            guidewire was placed and the scope was withdrawn.                            Dilations were performed with Savary dilators with                            mild resistance at 13 mm, 14 mm, 15 mm. Estimated                            blood loss was minimal.                           A single 5 mm mucosal nodule with a localized                            distribution was found in the distal esophagus, 34                            cm from the incisors. Biopsies were taken with a                            cold forceps for histology. Estimated blood loss  was minimal.                           LA Grade C (one or more mucosal breaks continuous                            between tops of 2 or more mucosal folds, less than                            75% circumference) esophagitis with no bleeding was                            found in the distal esophagus. Biopsies from 32-34                            cm - R/O Barrett's                           The exam of the esophagus was otherwise normal.                           A small hiatal hernia was present.                           The exam of the stomach was otherwise normal.                           The duodenal bulb and second portion of the                            duodenum were normal. Complications:            No immediate complications. Estimated Blood Loss:     Estimated blood loss was minimal. Impression:               - Benign-appearing esophageal stenosis. Dilated.                           - Mucosal nodule found in the esophagus. Biopsied.                            - LA Grade C reflux esophagitis.                           - Small hiatal hernia.                           - Normal duodenal bulb and second portion of the                            duodenum. Recommendation:           - Patient has a contact number available for                            emergencies. The signs and symptoms of potential  delayed complications were discussed with the                            patient. Return to normal activities tomorrow.                            Written discharge instructions were provided to the                            patient.                           - Clear liquid diet for 2 hours, advance as                            tolerated to soft diet today. Resume prior diet                            tomorrow.                           - Continue present medications.                           - Protonix 40 mg po bid, 1 year of refills.                           - Await pathology results.                           - Return to GI office in 1 month. Ladene Artist, MD 11/19/2016 10:37:14 AM This report has been signed electronically.

## 2016-11-19 NOTE — Progress Notes (Signed)
Called to room to assist during endoscopic procedure.  Patient ID and intended procedure confirmed with present staff. Received instructions for my participation in the procedure from the performing physician.  

## 2016-11-20 ENCOUNTER — Telehealth: Payer: Self-pay | Admitting: *Deleted

## 2016-11-20 NOTE — Telephone Encounter (Signed)
No answer. Name identifier. Message left to call if any questions or concerns. 

## 2016-11-20 NOTE — Telephone Encounter (Signed)
No answer. Name identifier. Message left to call if questions or concerns and we will attempt to call later in the day. 

## 2016-12-04 ENCOUNTER — Encounter: Payer: Self-pay | Admitting: Gastroenterology

## 2016-12-05 DIAGNOSIS — F3342 Major depressive disorder, recurrent, in full remission: Secondary | ICD-10-CM | POA: Diagnosis not present

## 2016-12-06 ENCOUNTER — Ambulatory Visit
Admission: RE | Admit: 2016-12-06 | Discharge: 2016-12-06 | Disposition: A | Discharge status: Home / Self Care | Payer: PPO | Source: Ambulatory Visit | Attending: Internal Medicine | Admitting: Internal Medicine

## 2016-12-06 DIAGNOSIS — Z1231 Encounter for screening mammogram for malignant neoplasm of breast: Secondary | ICD-10-CM | POA: Diagnosis not present

## 2017-01-11 ENCOUNTER — Encounter: Payer: Self-pay | Admitting: Gastroenterology

## 2017-01-11 ENCOUNTER — Encounter (INDEPENDENT_AMBULATORY_CARE_PROVIDER_SITE_OTHER): Payer: Self-pay

## 2017-01-11 ENCOUNTER — Ambulatory Visit (INDEPENDENT_AMBULATORY_CARE_PROVIDER_SITE_OTHER): Payer: PPO | Admitting: Gastroenterology

## 2017-01-11 VITALS — BP 112/80 | HR 86 | Ht 61.5 in | Wt 166.0 lb

## 2017-01-11 DIAGNOSIS — K2100 Gastro-esophageal reflux disease with esophagitis, without bleeding: Secondary | ICD-10-CM

## 2017-01-11 DIAGNOSIS — K2270 Barrett's esophagus without dysplasia: Secondary | ICD-10-CM | POA: Diagnosis not present

## 2017-01-11 NOTE — Patient Instructions (Signed)
You will be due for a recall Upper Endoscopy in 11/2018. We will send you a reminder in the mail when it gets closer to that time.  Normal BMI (Body Mass Index- based on height and weight) is between 23 and 30. Your BMI today is Body mass index is 30.86 kg/m. Marland Kitchen Please consider follow up  regarding your BMI with your Primary Care Provider.  Thank you for choosing me and [facility].  Pricilla Riffle. Dagoberto Ligas., MD., Marval Regal

## 2017-01-11 NOTE — Progress Notes (Signed)
    History of Present Illness: This is a 67 year old female returning for follow-up of GERD with esophagitis, Barrett's esophagus and esophageal stricture.  She underwent EGD with dilation in November 2018.  She denies dysphasia.  Her reflux symptoms are under good control.  She has multiple questions.  EGD 11/2016 - Benign-appearing esophageal stenosis. Dilated to 15 mm. - Mucosal nodule found in the esophagus. Biopsied - Barrett's without dysplasia.Marland Kitchen - LA Grade C reflux esophagitis. - Small hiatal hernia. - Normal duodenal bulb and second portion of the duodenum.  Current Medications, Allergies, Past Medical History, Past Surgical History, Family History and Social History were reviewed in [employer] record.  Physical Exam: General: Well developed, well nourished, no acute distress Head: Normocephalic and atraumatic Eyes:  sclerae anicteric, EOMI Ears: Normal auditory acuity Neurological: Alert oriented x 4, grossly nonfocal Psychological:  Alert and cooperative. Normal mood and affect  Assessment and Recommendations:  1. LA Class C erosive esophagitis, Barrett's esophagus short segment without dysplasia, esophageal stricture-dysphagia resolved.  Closely follow antireflux measures.  Continue pantoprazole 40 mg twice daily long term.  EGD in 11/2018 for Barrett's surveillance.  We addressed her multiple questions to her satisfaction.  REV in 1 year.  I spent 15 minutes of face-to-face time with the patient. Greater than 50% of the time was spent counseling and coordinating care.

## 2017-02-20 DIAGNOSIS — F3342 Major depressive disorder, recurrent, in full remission: Secondary | ICD-10-CM | POA: Diagnosis not present

## 2017-03-12 DIAGNOSIS — R5383 Other fatigue: Secondary | ICD-10-CM | POA: Diagnosis not present

## 2017-03-12 DIAGNOSIS — R002 Palpitations: Secondary | ICD-10-CM | POA: Diagnosis not present

## 2017-03-12 DIAGNOSIS — M810 Age-related osteoporosis without current pathological fracture: Secondary | ICD-10-CM | POA: Diagnosis not present

## 2017-03-19 DIAGNOSIS — M5431 Sciatica, right side: Secondary | ICD-10-CM | POA: Diagnosis not present

## 2017-03-19 DIAGNOSIS — R5383 Other fatigue: Secondary | ICD-10-CM | POA: Diagnosis not present

## 2017-03-19 DIAGNOSIS — K210 Gastro-esophageal reflux disease with esophagitis: Secondary | ICD-10-CM | POA: Diagnosis not present

## 2017-03-19 DIAGNOSIS — G4700 Insomnia, unspecified: Secondary | ICD-10-CM | POA: Diagnosis not present

## 2017-03-29 DIAGNOSIS — F3342 Major depressive disorder, recurrent, in full remission: Secondary | ICD-10-CM | POA: Diagnosis not present

## 2017-04-16 DIAGNOSIS — R200 Anesthesia of skin: Secondary | ICD-10-CM | POA: Diagnosis not present

## 2017-04-24 DIAGNOSIS — F3342 Major depressive disorder, recurrent, in full remission: Secondary | ICD-10-CM | POA: Diagnosis not present

## 2017-05-20 ENCOUNTER — Ambulatory Visit (INDEPENDENT_AMBULATORY_CARE_PROVIDER_SITE_OTHER): Payer: PPO | Admitting: Neurology

## 2017-05-20 ENCOUNTER — Encounter: Payer: Self-pay | Admitting: Neurology

## 2017-05-20 VITALS — BP 140/90 | HR 118 | Ht 62.0 in | Wt 165.0 lb

## 2017-05-20 DIAGNOSIS — G479 Sleep disorder, unspecified: Secondary | ICD-10-CM

## 2017-05-20 DIAGNOSIS — G2581 Restless legs syndrome: Secondary | ICD-10-CM | POA: Diagnosis not present

## 2017-05-20 DIAGNOSIS — G4719 Other hypersomnia: Secondary | ICD-10-CM | POA: Diagnosis not present

## 2017-05-20 NOTE — Patient Instructions (Addendum)
Based on your symptoms and your exam I believe we should look for an underlying organic sleep disorder, such as obstructive sleep apnea/OSA, or dream enactments, or periodic leg movement disorder (PLMD), which is leg twitching in sleep.  PLMs are typically associated with symptoms of restless leg syndrome.   If this leg movement disorder disrupts your sleep, it can cause other problems such as daytime sleepiness. Please do not drive if you feel sleepy. Sometimes other conditions can cause leg twitching at night including thyroid dysfunction and iron deficiency. There are some symptomatic medications available for treatment. At this point, we should proceed with a sleep study to further delineate your sleep-related issues.   If you have sleep apnea, I want you to consider treatment with CPAP. Please remember, the risks and ramifications of moderate to severe obstructive sleep apnea or OSA are: Cardiovascular disease, including congestive heart failure, stroke, difficult to control hypertension, arrhythmias, and even type 2 diabetes has been linked to untreated OSA. Sleep apnea causes disruption of sleep and sleep deprivation in most cases, which, in turn, can cause recurrent headaches, problems with memory, mood, concentration, focus, and vigilance. Most people with untreated sleep apnea report excessive daytime sleepiness, which can affect their ability to drive. Please do not drive if you feel sleepy.  Please do not drink alcohol, as it does not combine well with your current medication regimen.   I will likely see you back after your sleep study to go over the test results and where to go from there. We will call you after your sleep study to advise about the results (most likely, you will hear from [place], my nurse) and to set up an appointment at the time.    Our sleep lab administrative assistant will call you to schedule your sleep study. If you don't hear back from her by about 2 weeks from now,  please feel free to call her at [phone]. This is her direct line and please leave a message with your phone number to call back if you get the voicemail box. She will call back as soon as possible.

## 2017-05-20 NOTE — Progress Notes (Signed)
Subjective:    Patient ID: Kathleen Vasquez is a 67 y.o. female.  HPI     Star Age, MD, PhD [facility] [address] [place], [place] [address]  Dear Dr. Ashby Dawes,  I saw your patient, Ilah Boule, upon your kind request in my neurologic clinic today for initial consultation of her sleep disorder, in particular, difficulty with sleep initiation and sleep maintenance, concern for underlying obstructive sleep apnea. The patient is unaccompanied today. As you know, Ms. Luepke is a 67 year old right-handed woman with an underlying medical history of reflux disease with history of esophagitis, sciatica, depression, anxiety, arthritis, allergies, and borderline obesity, who reports chronic difficulty with her sleep for the past 20+ years. I reviewed your office note from 03/19/2017, which you kindly included. She has blood work pending routinely through office in August 2019. She has been followed by geriatric psychiatrist, Dr. Casimiro Needle.  For sleep, she has tried multiple prescription medications including Lunesta, Halcion, Silenor. Of note, she is currently on several psychotropic medications and neuro tropic medications including gabapentin 300 mg in the morning, 300 and today and 900 mg at night, BuSpar 10 mg strength 2 tablets 3 times a day, Cymbalta 60 mg daily, Seroquel. She reports a history of restless leg syndrome, for which gabapentin and magnesium have been helpful. She has found over the course of years that the medications exacerbate her restless leg symptoms such as Celexa. Her Epworth sleepiness score is 8 out of 24, fatigue score is 54 out of 63. She is not sure if she snores, but has gained weight. She drinks 2 drinks of alcohol each night, usually bourbon with cola. She smokes cigarettes occasionally.  She has a TV on at night. Has one dog and one cat. She tries to be in bed between 10 PM and midnight, for the past 2 weeks she has  been on Seroquel, 50 mg at night, sometimes 100 mg, she takes a second pill in the middle of the night if she wakes up at times. With the Seroquel she is able to achieve sleep within 15 minutes or so, staying asleep is not much problem.   Her Past Medical History Is Significant For: Past Medical History:  Diagnosis Date  . Allergy   . Anxiety   . Arthritis   . Depression   . GERD (gastroesophageal reflux disease)   . Hearing loss   . Hiatal hernia   . Incontinence   . Osteoporosis   . Overactive bladder   . Restless leg syndrome     Her Past Surgical History Is Significant For: Past Surgical History:  Procedure Laterality Date  . BREAST BIOPSY     left- bx only, no lymph nodes removed  . BREAST EXCISIONAL BIOPSY Left   . BUNIONECTOMY  02/2008   left foot. ALSO HAD DISLOCATED TOE & CROOKED TOE  . CERVICAL BIOPSY    . EYE SURGERY  age 19   straightened both eyes  . thumb surgery  02/1997   right    Her Family History Is Significant For: Family History  Problem Relation Age of Onset  . Arthritis Mother   . Stroke Mother   . Diabetes Father   . Heart disease Father   . Hypertension Brother   . Colon cancer Paternal Grandfather   . Breast cancer Paternal Aunt   . Heart attack Brother   . Esophageal cancer Neg Hx   . Stomach cancer Neg Hx   .  Rectal cancer Neg Hx     Her Social History Is Significant For: Social History   Socioeconomic History  . Marital status: Single    Spouse name: Not on file  . Number of children: Not on file  . Years of education: Not on file  . Highest education level: Not on file  Occupational History  . Occupation: Retired  Scientific laboratory technician  . Financial resource strain: Not on file  . Food insecurity:    Worry: Not on file    Inability: Not on file  . Transportation needs:    Medical: Not on file    Non-medical: Not on file  Tobacco Use  . Smoking status: Current Some Day Smoker    Packs/day: 1.00  . Smokeless tobacco: Never Used   Substance and Sexual Activity  . Alcohol use: Yes    Alcohol/week: 1.0 oz    Types: 2 Standard drinks or equivalent per week    Comment: 2/DAY 4 DAYS A WEEK  . Drug use: No  . Sexual activity: Not on file  Lifestyle  . Physical activity:    Days per week: Not on file    Minutes per session: Not on file  . Stress: Not on file  Relationships  . Social connections:    Talks on phone: Not on file    Gets together: Not on file    Attends religious service: Not on file    Active member of club or organization: Not on file    Attends meetings of clubs or organizations: Not on file    Relationship status: Not on file  Other Topics Concern  . Not on file  Social History Narrative  . Not on file    Her Allergies Are:  No Known Allergies:   Her Current Medications Are:  Outpatient Encounter Medications as of 05/20/2017  Medication Sig  . busPIRone (BUSPAR) 10 MG tablet Take 20 mg by mouth 3 (three) times daily.  . Calcium Citrate-Vitamin D (CALCIUM + D PO) Take 2 tablets by mouth daily.   . DULoxetine (CYMBALTA) 30 MG capsule Take 60 mg by mouth daily.  Marland Kitchen gabapentin (NEURONTIN) 300 MG capsule Take 1 tablet every morning, 1 tablet at lunch, 3 tablets every night  . Glucos-Chond-Hyal Ac-Ca Fructo (MOVE FREE JOINT HEALTH ADVANCE) TABS Take 1 tablet by mouth daily.  Marland Kitchen glucosamine-chondroitin 500-400 MG tablet Take 1 tablet by mouth daily.  Marland Kitchen loratadine (CLARITIN) 10 MG tablet Take 10 mg by mouth daily as needed for allergies.   . magnesium oxide (MAG-OX) 400 MG tablet Take 400 mg by mouth every evening.  . Misc Natural Products (OSTEO BI-FLEX JOINT SHIELD PO) Take 1 tablet by mouth daily.  . Multiple Vitamin (MULTIVITAMIN WITH MINERALS) TABS tablet Take 1 tablet by mouth daily.  . pantoprazole (PROTONIX) 40 MG tablet Take 40 mg by mouth twice per day .  Marland Kitchen QUEtiapine (SEROQUEL) 50 MG tablet Take 50-100 mg by mouth at bedtime.  . tolterodine (DETROL) 1 MG tablet Take 2 mg by mouth every  other day. On even days  . [DISCONTINUED] Eszopiclone (ESZOPICLONE) 3 MG TABS Take 3 mg by mouth at bedtime. Take immediately before bedtime  . [DISCONTINUED] SILENOR 3 MG TABS 1 tablet daily.   . [DISCONTINUED] triazolam (HALCION) 0.25 MG tablet Take 0.25 mg by mouth at bedtime.   No facility-administered encounter medications on file as of 05/20/2017.   :  Review of Systems:  Out of a complete 14 point review of systems,  all are reviewed and negative with the exception of these symptoms as listed below: Review of Systems  Neurological:       Pt presents today to discuss her sleep. Pt has never had a sleep study and is unsure if she snores.  Epworth Sleepiness Scale 0= would never doze 1= slight chance of dozing 2= moderate chance of dozing 3= high chance of dozing  Sitting and reading: 0 Watching TV: 1 Sitting inactive in a public place (ex. Theater or meeting): 3 As a passenger in a car for an hour without a break: 2 Lying down to rest in the afternoon: 2 Sitting and talking to someone: 0 Sitting quietly after lunch (no alcohol): 0 In a car, while stopped in traffic: 0 Total: 8     Objective:  Neurological Exam  Physical Exam Physical Examination:   Vitals:   05/20/17 1310  BP: 140/90  Pulse: (!) 118   General Examination: The patient is a very pleasant 67 y.o. female in no acute distress. She appears well-developed and well-nourished and well groomed.   HEENT: Normocephalic, atraumatic, pupils are equal, round and reactive to light and accommodation. She wears corrective eyeglasses, extraocular tracking is good, hearing is grossly intact, face is symmetric, she has no lip, neck or jaw tremor. She has no carotid bruits. Airway examination reveals mild to moderate mouth dryness, adequate dental hygiene, moderate airway crowding due to smaller airway entry, uvula is normal in size, tonsils are 1+ bilaterally. Mallampati is class II. Tongue protrudes centrally and palate  elevates symmetrically. Neck circumference is 13-3/4 inches.  Chest: Clear to auscultation without wheezing, rhonchi or crackles noted.  Heart: S1+S2+0, regular and normal without murmurs, rubs or gallops noted.   Abdomen: Soft, non-tender and non-distended with normal bowel sounds appreciated on auscultation.  Extremities: There is no pitting edema in the distal lower extremities bilaterally. Pedal pulses are intact.  Skin: Warm and dry without trophic changes noted. There are mild varicose and spider veins in the distal legs.  Musculoskeletal: exam reveals no obvious joint deformities, tenderness or joint swelling or erythema, with the exception of arthritic changes in both hands.   Neurologically:  Mental status: The patient is awake, alert and oriented in all 4 spheres. Her immediate and remote memory, attention, language skills and fund of knowledge are appropriate. There is no evidence of aphasia, agnosia, apraxia or anomia. Speech is clear with normal prosody and enunciation. Thought process is linear. Mood is normal and affect is normal.  Cranial nerves II - XII are as described above under HEENT exam. In addition: shoulder shrug is normal with equal shoulder height noted. Motor exam: Normal bulk, strength and tone is noted. There is no drift, tremor or rebound. Romberg is negative. Reflexes are 1+ throughout. Fine motor skills and coordination: intact with normal finger taps, normal hand movements, normal rapid alternating patting, normal foot taps and normal foot agility.   Cerebellar testing: No dysmetria or intention tremor, heel to shin is unremarkable bilaterally. There is no truncal or gait ataxia.  Sensory exam: intact to light touch in the upper and lower extremities.  Gait, station and balance: She stands easily. No veering to one side is noted. No leaning to one side is noted. Posture is age-appropriate with mild increase in curvature noted in the upper back, and stance is  narrow based. Gait shows normal stride length and normal pace. No problems turning are noted. Tandem walk is somewhat challenging for her, but wearing flip flops.  Assessment and Plan:  In summary, CAMARA RENSTROM is a very pleasant 67 y.o.-year old female with an underlying medical history of reflux disease with history of esophagitis, sciatica, depression, anxiety, arthritis, allergies, and borderline obesity, who presents for a sleep consultation for chronic sleep difficulties, particularly nonrestorative sleep, daytime tiredness, sleep initiation problems. She is not sure if she snores, her history and physical exam would support underlying obstructive sleep apnea. She also endorses restless leg syndrome, currently controlled on a combination of gabapentin and magnesium. I had a long chat with the patient about my findings and the diagnosis of OSA, its prognosis and treatment options. We talked about medical treatments, surgical interventions and non-pharmacological approaches. I explained in particular the risks and ramifications of untreated moderate to severe OSA, especially with respect to developing cardiovascular disease down the Road, including congestive heart failure, difficult to treat hypertension, cardiac arrhythmias, or stroke. Even type 2 diabetes has, in part, been linked to untreated OSA. Symptoms of untreated OSA include daytime sleepiness, memory problems, mood irritability and mood disorder such as depression and anxiety, lack of energy, as well as recurrent headaches, especially morning headaches. We talked about smoking cessation and trying to maintain a healthy lifestyle in general, as well as the importance of weight control. I encouraged the patient to eat healthy, exercise daily and keep well hydrated, to keep a scheduled bedtime and wake time routine, to not skip any meals and eat healthy snacks in between meals. I advised the patient not to drive when feeling sleepy.  She is also advised not to drink alcohol with her current medication regimen.  I recommended the following at this time: sleep study with potential positive airway pressure titration. (We will score hypopneas at 4%).   I explained the sleep test procedure to the patient and also outlined possible surgical and non-surgical treatment options of OSA, including the use of a custom-made dental device (which would require a referral to a specialist dentist or oral surgeon), upper airway surgical options, such as pillar implants, radiofrequency surgery, tongue base surgery, and UPPP (which would involve a referral to an ENT surgeon). Rarely, jaw surgery such as mandibular advancement may be considered.  I also explained the CPAP treatment option to the patient, who indicated that she would be willing to try CPAP if the need arises. I explained the importance of being compliant with PAP treatment, not only for insurance purposes but primarily to improve Her symptoms, and for the patient's long term health benefit, including to reduce Her cardiovascular risks. I answered all her questions today and the patient was in agreement. I plan on seeing her back after the sleep study is completed and encouraged her to call with any interim questions, concerns, problems or updates.   Thank you very much for allowing me to participate in the care of this nice patient. If I can be of any further assistance to you please do not hesitate to call me at [phone].  Sincerely,   Star Age, MD, PhD

## 2017-05-21 DIAGNOSIS — M810 Age-related osteoporosis without current pathological fracture: Secondary | ICD-10-CM | POA: Diagnosis not present

## 2017-08-12 DIAGNOSIS — H4423 Degenerative myopia, bilateral: Secondary | ICD-10-CM | POA: Diagnosis not present

## 2017-08-12 DIAGNOSIS — H43813 Vitreous degeneration, bilateral: Secondary | ICD-10-CM | POA: Diagnosis not present

## 2017-08-27 DIAGNOSIS — R5383 Other fatigue: Secondary | ICD-10-CM | POA: Diagnosis not present

## 2017-08-27 DIAGNOSIS — M5431 Sciatica, right side: Secondary | ICD-10-CM | POA: Diagnosis not present

## 2017-08-27 DIAGNOSIS — Z0000 Encounter for general adult medical examination without abnormal findings: Secondary | ICD-10-CM | POA: Diagnosis not present

## 2017-09-04 DIAGNOSIS — G4700 Insomnia, unspecified: Secondary | ICD-10-CM | POA: Diagnosis not present

## 2017-09-04 DIAGNOSIS — Z0000 Encounter for general adult medical examination without abnormal findings: Secondary | ICD-10-CM | POA: Diagnosis not present

## 2017-09-04 DIAGNOSIS — K210 Gastro-esophageal reflux disease with esophagitis: Secondary | ICD-10-CM | POA: Diagnosis not present

## 2017-09-04 DIAGNOSIS — R5383 Other fatigue: Secondary | ICD-10-CM | POA: Diagnosis not present

## 2017-09-04 DIAGNOSIS — G2581 Restless legs syndrome: Secondary | ICD-10-CM | POA: Diagnosis not present

## 2017-09-04 DIAGNOSIS — N3281 Overactive bladder: Secondary | ICD-10-CM | POA: Diagnosis not present

## 2017-09-04 DIAGNOSIS — L509 Urticaria, unspecified: Secondary | ICD-10-CM | POA: Diagnosis not present

## 2017-09-04 DIAGNOSIS — M810 Age-related osteoporosis without current pathological fracture: Secondary | ICD-10-CM | POA: Diagnosis not present

## 2017-09-04 DIAGNOSIS — Z23 Encounter for immunization: Secondary | ICD-10-CM | POA: Diagnosis not present

## 2017-09-04 DIAGNOSIS — M5431 Sciatica, right side: Secondary | ICD-10-CM | POA: Diagnosis not present

## 2017-10-04 DIAGNOSIS — F3342 Major depressive disorder, recurrent, in full remission: Secondary | ICD-10-CM | POA: Diagnosis not present

## 2017-10-28 ENCOUNTER — Other Ambulatory Visit: Payer: Self-pay | Admitting: Internal Medicine

## 2017-10-28 DIAGNOSIS — Z1231 Encounter for screening mammogram for malignant neoplasm of breast: Secondary | ICD-10-CM

## 2017-11-08 ENCOUNTER — Encounter: Payer: Self-pay | Admitting: Gastroenterology

## 2017-11-08 ENCOUNTER — Ambulatory Visit (INDEPENDENT_AMBULATORY_CARE_PROVIDER_SITE_OTHER): Payer: PPO | Admitting: Gastroenterology

## 2017-11-08 VITALS — BP 120/80 | HR 86 | Ht 62.0 in | Wt 164.5 lb

## 2017-11-08 DIAGNOSIS — K2270 Barrett's esophagus without dysplasia: Secondary | ICD-10-CM | POA: Diagnosis not present

## 2017-11-08 DIAGNOSIS — K2100 Gastro-esophageal reflux disease with esophagitis, without bleeding: Secondary | ICD-10-CM

## 2017-11-08 DIAGNOSIS — R197 Diarrhea, unspecified: Secondary | ICD-10-CM | POA: Diagnosis not present

## 2017-11-08 NOTE — Progress Notes (Signed)
    History of Present Illness: This is a 67 year old female with diarrhea. She has a history of GERD, Barrett's and esophageal stricture.  Reflux symptoms have been well controlled on current regimen.  She relates persistent problems with diarrhea, between 1 and 3 episodes of loose stools daily.  Stools are frequently urgent.  Occasionally accompanied by lower abdominal cramping.  Symptoms worsened over the past month and change the timing from pantoprazole from before breakfast and before lunch to before breakfast and before dinner and she noted improvement in diarrhea. Colonoscopy 03/2016 showed mild diverticulosis.  She does not note any particular foods that exacerbate diarrhea.  Current Medications, Allergies, Past Medical History, Past Surgical History, Family History and Social History were reviewed in [employer] record.  Physical Exam: General: Well developed, well nourished, no acute distress Head: Normocephalic and atraumatic Eyes:  sclerae anicteric, EOMI Ears: Normal auditory acuity Mouth: No deformity or lesions Lungs: Clear throughout to auscultation Heart: Regular rate and rhythm; no murmurs, rubs or bruits Abdomen: Soft, non tender and non distended. No masses, hepatosplenomegaly or hernias noted. Normal Bowel sounds Rectal: Not done Musculoskeletal: Symmetrical with no gross deformities  Pulses:  Normal pulses noted Extremities: No clubbing, cyanosis, edema or deformities noted Neurological: Alert oriented x 4, grossly nonfocal Psychological:  Alert and cooperative. Normal mood and affect   Assessment and Recommendations:  1. Diarrhea.  Rule out pantoprazole side effect, mag oxide side effect.  Reduce pantoprazole to once daily if diarrhea does not abate discontinue mag oxide and if diarrhea does not abate begin probiotic daily such as align.  If diarrhea persists we will discontinue pantoprazole and switch to another PPI.  If diarrhea still continues  will pursue further evaluation to include stool studies, celiac testing, etc.  2. GERD with erosive esophagitis and short segment Barrett's esophagus without dysplasia.  See #1. Continue antireflux measures.  Surveillance EGD recommended in November 2020.

## 2017-11-08 NOTE — Patient Instructions (Addendum)
Decrease your pantoprazole 40 mg to once daily.   If your C[place] is not better in one week then stop magnesium oxide x 1 week. If your symptoms are still not better then start Align or a probiotic x 1 week. If still no better then contact our office for further testing.  Thank you for choosing me and [facility].  Pricilla Riffle. Dagoberto Ligas., MD., Marval Regal

## 2017-11-26 DIAGNOSIS — M810 Age-related osteoporosis without current pathological fracture: Secondary | ICD-10-CM | POA: Diagnosis not present

## 2017-12-03 DIAGNOSIS — L219 Seborrheic dermatitis, unspecified: Secondary | ICD-10-CM | POA: Diagnosis not present

## 2017-12-09 ENCOUNTER — Ambulatory Visit
Admission: RE | Admit: 2017-12-09 | Discharge: 2017-12-09 | Disposition: A | Discharge status: Home / Self Care | Payer: PPO | Source: Ambulatory Visit | Attending: Internal Medicine | Admitting: Internal Medicine

## 2017-12-09 DIAGNOSIS — Z1231 Encounter for screening mammogram for malignant neoplasm of breast: Secondary | ICD-10-CM | POA: Diagnosis not present

## 2017-12-13 DIAGNOSIS — F3342 Major depressive disorder, recurrent, in full remission: Secondary | ICD-10-CM | POA: Diagnosis not present

## 2017-12-16 ENCOUNTER — Other Ambulatory Visit: Payer: Self-pay | Admitting: Gastroenterology

## 2017-12-16 DIAGNOSIS — K2100 Gastro-esophageal reflux disease with esophagitis, without bleeding: Secondary | ICD-10-CM

## 2018-01-16 DIAGNOSIS — F3342 Major depressive disorder, recurrent, in full remission: Secondary | ICD-10-CM | POA: Diagnosis not present

## 2018-03-12 DIAGNOSIS — R5383 Other fatigue: Secondary | ICD-10-CM | POA: Diagnosis not present

## 2018-03-12 DIAGNOSIS — K210 Gastro-esophageal reflux disease with esophagitis: Secondary | ICD-10-CM | POA: Diagnosis not present

## 2018-03-12 DIAGNOSIS — M1990 Unspecified osteoarthritis, unspecified site: Secondary | ICD-10-CM | POA: Diagnosis not present

## 2018-03-19 DIAGNOSIS — G2581 Restless legs syndrome: Secondary | ICD-10-CM | POA: Diagnosis not present

## 2018-03-19 DIAGNOSIS — K210 Gastro-esophageal reflux disease with esophagitis: Secondary | ICD-10-CM | POA: Diagnosis not present

## 2018-03-19 DIAGNOSIS — M810 Age-related osteoporosis without current pathological fracture: Secondary | ICD-10-CM | POA: Diagnosis not present

## 2018-03-19 DIAGNOSIS — L309 Dermatitis, unspecified: Secondary | ICD-10-CM | POA: Diagnosis not present

## 2018-03-19 DIAGNOSIS — L509 Urticaria, unspecified: Secondary | ICD-10-CM | POA: Diagnosis not present

## 2018-05-16 DIAGNOSIS — F3342 Major depressive disorder, recurrent, in full remission: Secondary | ICD-10-CM | POA: Diagnosis not present

## 2018-06-03 DIAGNOSIS — M810 Age-related osteoporosis without current pathological fracture: Secondary | ICD-10-CM | POA: Diagnosis not present

## 2018-08-04 ENCOUNTER — Other Ambulatory Visit: Payer: Self-pay

## 2018-08-18 DIAGNOSIS — F3342 Major depressive disorder, recurrent, in full remission: Secondary | ICD-10-CM | POA: Diagnosis not present

## 2018-09-23 DIAGNOSIS — K210 Gastro-esophageal reflux disease with esophagitis: Secondary | ICD-10-CM | POA: Diagnosis not present

## 2018-09-23 DIAGNOSIS — L509 Urticaria, unspecified: Secondary | ICD-10-CM | POA: Diagnosis not present

## 2018-09-23 DIAGNOSIS — M810 Age-related osteoporosis without current pathological fracture: Secondary | ICD-10-CM | POA: Diagnosis not present

## 2018-09-23 DIAGNOSIS — Z7189 Other specified counseling: Secondary | ICD-10-CM | POA: Diagnosis not present

## 2018-09-23 DIAGNOSIS — G2581 Restless legs syndrome: Secondary | ICD-10-CM | POA: Diagnosis not present

## 2018-09-23 DIAGNOSIS — Z0000 Encounter for general adult medical examination without abnormal findings: Secondary | ICD-10-CM | POA: Diagnosis not present

## 2018-10-01 DIAGNOSIS — M810 Age-related osteoporosis without current pathological fracture: Secondary | ICD-10-CM | POA: Diagnosis not present

## 2018-10-01 DIAGNOSIS — K210 Gastro-esophageal reflux disease with esophagitis: Secondary | ICD-10-CM | POA: Diagnosis not present

## 2018-10-01 DIAGNOSIS — M25512 Pain in left shoulder: Secondary | ICD-10-CM | POA: Diagnosis not present

## 2018-10-01 DIAGNOSIS — Z7189 Other specified counseling: Secondary | ICD-10-CM | POA: Diagnosis not present

## 2018-10-01 DIAGNOSIS — Z0000 Encounter for general adult medical examination without abnormal findings: Secondary | ICD-10-CM | POA: Diagnosis not present

## 2018-10-01 DIAGNOSIS — R5383 Other fatigue: Secondary | ICD-10-CM | POA: Diagnosis not present

## 2018-10-01 DIAGNOSIS — L509 Urticaria, unspecified: Secondary | ICD-10-CM | POA: Diagnosis not present

## 2018-10-01 DIAGNOSIS — N3281 Overactive bladder: Secondary | ICD-10-CM | POA: Diagnosis not present

## 2018-10-01 DIAGNOSIS — G2581 Restless legs syndrome: Secondary | ICD-10-CM | POA: Diagnosis not present

## 2018-10-01 DIAGNOSIS — Z23 Encounter for immunization: Secondary | ICD-10-CM | POA: Diagnosis not present

## 2018-10-01 DIAGNOSIS — M19012 Primary osteoarthritis, left shoulder: Secondary | ICD-10-CM | POA: Diagnosis not present

## 2018-10-02 DIAGNOSIS — F3342 Major depressive disorder, recurrent, in full remission: Secondary | ICD-10-CM | POA: Diagnosis not present

## 2018-10-14 DIAGNOSIS — H04123 Dry eye syndrome of bilateral lacrimal glands: Secondary | ICD-10-CM | POA: Diagnosis not present

## 2018-10-14 DIAGNOSIS — H4423 Degenerative myopia, bilateral: Secondary | ICD-10-CM | POA: Diagnosis not present

## 2018-10-14 DIAGNOSIS — H2513 Age-related nuclear cataract, bilateral: Secondary | ICD-10-CM | POA: Diagnosis not present

## 2018-10-14 DIAGNOSIS — H43813 Vitreous degeneration, bilateral: Secondary | ICD-10-CM | POA: Diagnosis not present

## 2018-10-22 DIAGNOSIS — M25512 Pain in left shoulder: Secondary | ICD-10-CM | POA: Diagnosis not present

## 2018-10-22 DIAGNOSIS — Z7189 Other specified counseling: Secondary | ICD-10-CM | POA: Diagnosis not present

## 2018-11-12 ENCOUNTER — Encounter: Payer: Self-pay | Admitting: Gastroenterology

## 2018-12-09 DIAGNOSIS — M810 Age-related osteoporosis without current pathological fracture: Secondary | ICD-10-CM | POA: Diagnosis not present

## 2019-02-02 DIAGNOSIS — F3342 Major depressive disorder, recurrent, in full remission: Secondary | ICD-10-CM | POA: Diagnosis not present

## 2019-03-12 ENCOUNTER — Encounter: Payer: Self-pay | Admitting: Gastroenterology

## 2019-04-02 ENCOUNTER — Other Ambulatory Visit: Payer: Self-pay

## 2019-04-02 ENCOUNTER — Ambulatory Visit (AMBULATORY_SURGERY_CENTER): Payer: Self-pay | Admitting: *Deleted

## 2019-04-02 VITALS — Temp 97.6°F | Ht 62.0 in | Wt 162.6 lb

## 2019-04-02 DIAGNOSIS — K22719 Barrett's esophagus with dysplasia, unspecified: Secondary | ICD-10-CM

## 2019-04-02 DIAGNOSIS — Z01818 Encounter for other preprocedural examination: Secondary | ICD-10-CM

## 2019-04-02 NOTE — Progress Notes (Signed)

## 2019-04-08 DIAGNOSIS — R5383 Other fatigue: Secondary | ICD-10-CM | POA: Diagnosis not present

## 2019-04-08 DIAGNOSIS — L509 Urticaria, unspecified: Secondary | ICD-10-CM | POA: Diagnosis not present

## 2019-04-08 DIAGNOSIS — G2581 Restless legs syndrome: Secondary | ICD-10-CM | POA: Diagnosis not present

## 2019-04-08 DIAGNOSIS — N3281 Overactive bladder: Secondary | ICD-10-CM | POA: Diagnosis not present

## 2019-04-08 DIAGNOSIS — K2100 Gastro-esophageal reflux disease with esophagitis, without bleeding: Secondary | ICD-10-CM | POA: Diagnosis not present

## 2019-04-14 ENCOUNTER — Ambulatory Visit (INDEPENDENT_AMBULATORY_CARE_PROVIDER_SITE_OTHER): Payer: PPO

## 2019-04-14 ENCOUNTER — Other Ambulatory Visit: Payer: Self-pay | Admitting: Gastroenterology

## 2019-04-14 DIAGNOSIS — Z1159 Encounter for screening for other viral diseases: Secondary | ICD-10-CM

## 2019-04-15 DIAGNOSIS — M791 Myalgia, unspecified site: Secondary | ICD-10-CM | POA: Diagnosis not present

## 2019-04-15 DIAGNOSIS — N3281 Overactive bladder: Secondary | ICD-10-CM | POA: Diagnosis not present

## 2019-04-15 DIAGNOSIS — L509 Urticaria, unspecified: Secondary | ICD-10-CM | POA: Diagnosis not present

## 2019-04-15 DIAGNOSIS — M810 Age-related osteoporosis without current pathological fracture: Secondary | ICD-10-CM | POA: Diagnosis not present

## 2019-04-15 DIAGNOSIS — R5383 Other fatigue: Secondary | ICD-10-CM | POA: Diagnosis not present

## 2019-04-15 LAB — SARS CORONAVIRUS 2 (TAT 6-24 HRS): SARS Coronavirus 2: NEGATIVE

## 2019-04-17 ENCOUNTER — Other Ambulatory Visit: Payer: Self-pay

## 2019-04-17 ENCOUNTER — Ambulatory Visit (AMBULATORY_SURGERY_CENTER): Payer: PPO | Admitting: Gastroenterology

## 2019-04-17 ENCOUNTER — Encounter: Payer: Self-pay | Admitting: Gastroenterology

## 2019-04-17 VITALS — BP 111/72 | HR 84 | Temp 96.6°F | Resp 16 | Ht 62.0 in | Wt 162.6 lb

## 2019-04-17 DIAGNOSIS — E669 Obesity, unspecified: Secondary | ICD-10-CM | POA: Diagnosis not present

## 2019-04-17 DIAGNOSIS — K2270 Barrett's esophagus without dysplasia: Secondary | ICD-10-CM

## 2019-04-17 DIAGNOSIS — F419 Anxiety disorder, unspecified: Secondary | ICD-10-CM | POA: Diagnosis not present

## 2019-04-17 DIAGNOSIS — G2581 Restless legs syndrome: Secondary | ICD-10-CM | POA: Diagnosis not present

## 2019-04-17 DIAGNOSIS — D3A010 Benign carcinoid tumor of the duodenum: Secondary | ICD-10-CM

## 2019-04-17 DIAGNOSIS — K219 Gastro-esophageal reflux disease without esophagitis: Secondary | ICD-10-CM | POA: Diagnosis not present

## 2019-04-17 DIAGNOSIS — K2100 Gastro-esophageal reflux disease with esophagitis, without bleeding: Secondary | ICD-10-CM

## 2019-04-17 DIAGNOSIS — K2210 Ulcer of esophagus without bleeding: Secondary | ICD-10-CM | POA: Diagnosis not present

## 2019-04-17 DIAGNOSIS — F329 Major depressive disorder, single episode, unspecified: Secondary | ICD-10-CM | POA: Diagnosis not present

## 2019-04-17 HISTORY — PX: UPPER GASTROINTESTINAL ENDOSCOPY: SHX188

## 2019-04-17 MED ORDER — SODIUM CHLORIDE 0.9 % IV SOLN: 500.0000 mL | Freq: Once | INTRAVENOUS | Status: DC

## 2019-04-17 MED ORDER — PANTOPRAZOLE SODIUM 40 MG PO TBEC: 40.0000 mg | DELAYED_RELEASE_TABLET | Freq: Two times a day (BID) | ORAL | 11 refills | Status: DC

## 2019-04-17 NOTE — Progress Notes (Signed)
Report to PACU, RN, vss, BBS= Clear.  

## 2019-04-17 NOTE — Progress Notes (Signed)
Pt's states no medical or surgical changes since previsit or office visit.  Temp JB Vitals SM

## 2019-04-17 NOTE — Patient Instructions (Signed)
YOU HAD AN ENDOSCOPIC PROCEDURE TODAY AT THE [facility]:   Refer to the procedure report that was given to you for any specific questions about what was found during the examination.  If the procedure report does not answer your questions, please call your gastroenterologist to clarify.  If you requested that your care partner not be given the details of your procedure findings, then the procedure report has been included in a sealed envelope for you to review at your convenience later.  YOU SHOULD EXPECT: Some feelings of bloating in the abdomen. Passage of more gas than usual.  Walking can help get rid of the air that was put into your GI tract during the procedure and reduce the bloating. If you had a lower endoscopy (such as a colonoscopy or flexible sigmoidoscopy) you may notice spotting of blood in your stool or on the toilet paper. If you underwent a bowel prep for your procedure, you may not have a normal bowel movement for a few days.  Please Note:  You might notice some irritation and congestion in your nose or some drainage.  This is from the oxygen used during your procedure.  There is no need for concern and it should clear up in a day or so.  SYMPTOMS TO REPORT IMMEDIATELY:    Following upper endoscopy (EGD)  Vomiting of blood or coffee ground material  New chest pain or pain under the shoulder blades  Painful or persistently difficult swallowing  New shortness of breath  Fever of 100F or higher  Black, tarry-looking stools  For urgent or emergent issues, a gastroenterologist can be reached at any hour by calling [phone]. Do not use MyChart messaging for urgent concerns.    DIET:  We do recommend a small meal at first, but then you may proceed to your regular diet.  Drink plenty of fluids but you should avoid alcoholic beverages for 24 hours.  ACTIVITY:  You should plan to take it easy for the rest of today and you should NOT DRIVE or use heavy machinery  until tomorrow (because of the sedation medicines used during the test).    FOLLOW UP: Our staff will call the number listed on your records 48-72 hours following your procedure to check on you and address any questions or concerns that you may have regarding the information given to you following your procedure. If we do not reach you, we will leave a message.  We will attempt to reach you two times.  During this call, we will ask if you have developed any symptoms of COVID 19. If you develop any symptoms (ie: fever, flu-like symptoms, shortness of breath, cough etc.) before then, please call [phone].  If you test positive for Covid 19 in the 2 weeks post procedure, please call and report this information to us.    If any biopsies were taken you will be contacted by phone or by letter within the next 1-3 weeks.  Please call us at [phone] if you have not heard about the biopsies in 3 weeks.    SIGNATURES/CONFIDENTIALITY: You and/or your care partner have signed paperwork which will be entered into your electronic medical record.  These signatures attest to the fact that that the information above on your After Visit Summary has been reviewed and is understood.  Full responsibility of the confidentiality of this discharge information lies with you and/or your care-partner. 

## 2019-04-17 NOTE — Op Note (Signed)
[facility] Patient Name: Kathleen Vasquez Procedure Date: 04/17/2019 9:35 AM MRN: [id number] Endoscopist: Ladene Artist , MD Age: 69 Referring MD:  Date of Birth: 09-22-1950 Gender: Female Account #: [id number] Procedure:                Upper GI endoscopy Indications:              Surveillance for malignancy due to personal history                            of Barrett's esophagus, Gastroesophageal reflux                            disease Medicines:                Monitored Anesthesia Care Procedure:                Pre-Anesthesia Assessment:                           - Prior to the procedure, a History and Physical                            was performed, and patient medications and                            allergies were reviewed. The patient's tolerance of                            previous anesthesia was also reviewed. The risks                            and benefits of the procedure and the sedation                            options and risks were discussed with the patient.                            All questions were answered, and informed consent                            was obtained. Prior Anticoagulants: The patient has                            taken no previous anticoagulant or antiplatelet                            agents. ASA Grade Assessment: II - A patient with                            mild systemic disease. After reviewing the risks                            and benefits, the patient was deemed in  satisfactory condition to undergo the procedure.                           After obtaining informed consent, the endoscope was                            passed under direct vision. Throughout the                            procedure, the patient's blood pressure, pulse, and                            oxygen saturations were monitored continuously. The                            Endoscope was introduced through the mouth,  and                            advanced to the second part of duodenum. The upper                            GI endoscopy was accomplished without difficulty.                            The patient tolerated the procedure well. Scope In: Scope Out: Findings:                 LA Grade C (one or more mucosal breaks continuous                            between tops of 2 or more mucosal folds, less than                            75% circumference) esophagitis with no bleeding was                            found 29 cm from the incisors. Biopsies were taken                            with a cold forceps for histology.                           There were esophageal mucosal changes consistent                            with long-segment Barrett's esophagus present in                            the distal esophagus. The maximum longitudinal                            extent of these mucosal changes was 5 cm in length.  Mucosa was biopsied with a cold forceps for                            histology in a targeted manner at intervals of 2 cm                            in the lower third of the esophagus. One specimen                            bottle was sent to pathology.                           The exam of the esophagus was otherwise normal.                           A medium-sized hiatal hernia was present.                           The exam of the stomach was otherwise normal.                           A single 6 mm mucosal nodule with a localized                            distribution was found in the duodenal bulb.                            Biopsies were taken with a cold forceps for                            histology.                           The exam of the duodenum was otherwise normal. Complications:            No immediate complications. Estimated Blood Loss:     Estimated blood loss was minimal. Impression:               - LA Grade C reflux  esophagitis with no bleeding.                            Biopsied.                           - Esophageal mucosal changes consistent with                            long-segment Barrett's esophagus. Biopsied.                           - Medium-sized hiatal hernia.                           - Mucosal nodule found in the duodenum. Biopsied. Recommendation:           - Patient has  a contact number available for                            emergencies. The signs and symptoms of potential                            delayed complications were discussed with the                            patient. Return to normal activities tomorrow.                            Written discharge instructions were provided to the                            patient.                           - Resume previous diet.                           - Continue present medications.                           - Follow all antireflux measure long term.                           - Await pathology results.                           - Protonix (pantoprazole) 40 mg PO BID for 3                            months, then 40 mg po QD long term.                           - Return to GI office in 6-8 weeks.                           - Repeat upper endoscopy after studies are complete                            for surveillance based on pathology results. Ladene Artist, MD 04/17/2019 10:03:19 AM This report has been signed electronically.

## 2019-04-21 ENCOUNTER — Telehealth: Payer: Self-pay | Admitting: *Deleted

## 2019-04-21 ENCOUNTER — Telehealth: Payer: Self-pay

## 2019-04-21 NOTE — Telephone Encounter (Signed)
Attempted to reach patient for post-procedure f/u call. No answer. Left message for her to please not hesitate to call us if she has any questions/concerns regarding her care. 

## 2019-04-21 NOTE — Telephone Encounter (Signed)
  Follow up Call-  Call back number 04/17/2019 11/19/2016 09/19/2016  Post procedure Call Back phone  # [phone]  Permission to leave phone message Yes Yes Yes  Some recent data might be hidden     Patient questions:  Message left to call us if necessary.

## 2019-04-29 ENCOUNTER — Telehealth: Payer: Self-pay

## 2019-04-29 ENCOUNTER — Other Ambulatory Visit: Payer: Self-pay

## 2019-04-29 DIAGNOSIS — E340 Carcinoid syndrome: Secondary | ICD-10-CM

## 2019-04-29 NOTE — Progress Notes (Signed)
appt has been scheduled for 4/29 at 330 pm with Dr Rush Landmark to discuss EUS EMR.  Lab order has been entered. She will come in prior to the appt in the morning.

## 2019-04-29 NOTE — Telephone Encounter (Signed)
Appt scheduled for appt tomorrow with Dr Rush Landmark and lab order entered.  She will come in the morning prior the appt. The pt was advised and all questions answered to the best of my ability.

## 2019-04-29 NOTE — Telephone Encounter (Signed)
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 reasonable.Please move forward with ordering chromogranin A, though I suspect it will potentially be elevated as result of the PPI use that she needs for her esophagitis.Bambie Pizzolato, please schedule a clinic visit to discuss EMR and EUS.Okay if it is televisit and okay to be overbooked at 3:50 PM if needed.Can look for an EGD/EUS with EMR 90-minute slot if the patient wants to schedule this but it needs to be after the clinic visit.Please let now, and I know when it has been scheduled.Thank you.GM ----- Message ----- From: Ladene Artist, MD Sent: 04/29/2019  12:09 PM EDT To: Irving Copas., MD  Cordella Register,  This patient has a newly diagnosed 6 mm duodenal bulb carcinoid. Would you consider EUS/EMR to further evaluate and remove? We can order chromogranin A and any other blood work you recommend. She is aware you and your nurse may call to schedule.   Thanks,   Norberto Sorenson

## 2019-04-30 ENCOUNTER — Other Ambulatory Visit: Payer: PPO

## 2019-04-30 ENCOUNTER — Encounter: Payer: Self-pay | Admitting: Gastroenterology

## 2019-04-30 ENCOUNTER — Ambulatory Visit: Payer: PPO | Admitting: Gastroenterology

## 2019-04-30 VITALS — BP 98/64 | HR 110 | Temp 98.5°F | Ht 62.0 in | Wt 161.0 lb

## 2019-04-30 DIAGNOSIS — D3A010 Benign carcinoid tumor of the duodenum: Secondary | ICD-10-CM | POA: Diagnosis not present

## 2019-04-30 DIAGNOSIS — R933 Abnormal findings on diagnostic imaging of other parts of digestive tract: Secondary | ICD-10-CM

## 2019-04-30 DIAGNOSIS — K2100 Gastro-esophageal reflux disease with esophagitis, without bleeding: Secondary | ICD-10-CM | POA: Diagnosis not present

## 2019-04-30 DIAGNOSIS — D3A8 Other benign neuroendocrine tumors: Secondary | ICD-10-CM

## 2019-04-30 DIAGNOSIS — K2270 Barrett's esophagus without dysplasia: Secondary | ICD-10-CM | POA: Diagnosis not present

## 2019-04-30 DIAGNOSIS — E340 Carcinoid syndrome: Secondary | ICD-10-CM

## 2019-04-30 NOTE — Patient Instructions (Signed)
If you are age 71 or older, your body mass index should be between 23-30. Your Body mass index is 29.45 kg/m. If this is out of the aforementioned range listed, please consider follow up with your Primary Care Provider.  If you are age 53 or younger, your body mass index should be between 19-25. Your Body mass index is 29.45 kg/m. If this is out of the aformentioned range listed, please consider follow up with your Primary Care Provider.   You have been scheduled for an endoscopy. Please follow written instructions given to you at your visit today. If you use inhalers (even only as needed), please bring them with you on the day of your procedure.  We will obtain labs from your primary care physician.   Thank you for choosing me and [facility].  Dr. Rush Landmark

## 2019-05-01 ENCOUNTER — Encounter: Payer: Self-pay | Admitting: Gastroenterology

## 2019-05-01 DIAGNOSIS — K2270 Barrett's esophagus without dysplasia: Secondary | ICD-10-CM | POA: Insufficient documentation

## 2019-05-01 DIAGNOSIS — D3A8 Other benign neuroendocrine tumors: Secondary | ICD-10-CM | POA: Insufficient documentation

## 2019-05-01 DIAGNOSIS — D3A010 Benign carcinoid tumor of the duodenum: Secondary | ICD-10-CM | POA: Insufficient documentation

## 2019-05-01 DIAGNOSIS — K2100 Gastro-esophageal reflux disease with esophagitis, without bleeding: Secondary | ICD-10-CM | POA: Insufficient documentation

## 2019-05-01 DIAGNOSIS — R933 Abnormal findings on diagnostic imaging of other parts of digestive tract: Secondary | ICD-10-CM | POA: Insufficient documentation

## 2019-05-01 NOTE — Progress Notes (Signed)
GASTROENTEROLOGY OUTPATIENT CLINIC VISIT   Primary Care Provider Merrilee Seashore, [place] [address] [phone]  Referring Provider Dr. Fuller Plan  Patient Profile: Kathleen Vasquez is a 69 y.o. female with a pmh significant for Barrett's esophagus, hiatal hernia, GERD with esophagitis, arthritis, MDD/anxiety, osteoporosis, restless leg syndrome, recent diagnosis of duodenal neuroendocrine tumor.  The patient presents to the [facility] for an evaluation and management of problem(s) noted below:  Problem List 1. Abnormal endoscopy of upper gastrointestinal tract   2. Benign neuroendocrine tumor of duodenum   3. Barrett's esophagus without dysplasia   4. Gastroesophageal reflux disease with esophagitis without hemorrhage     History of Present Illness Please see initial consultation note and progress notes by Dr. Fuller Plan for full details of HPI.  Interval History The patient recently underwent an upper endoscopy for follow-up of prior Barrett's esophagus as well as GERD and esophagitis.  Patient states she had been having increasing nausea and vomiting recently.  Patient's endoscopy noted grade C distal esophagitis as well as a duodenal nodule.  Biopsies of the esophagitis showed no evidence of Barrett's but significant ulceration and inflammation.  The biopsies of the duodenal nodule returned as a duodenal carcinoid/neuroendocrine tumor.  It is for this reason that the patient is referred for discussion of potential endoscopic ultrasound as well as potential endoscopic resection of the duodenal neuroendocrine tumor.  The patient had been off PPI therapy for a while.  She has been reinitiated on her PPI after the findings of her esophagitis.  She feels better in regards to her nausea and vomiting symptoms.  She denies any significant dysphagia or odynophagia currently.  Patient denies any significant abdominal pain.  The patient  denies significant nonsteroidal use.  GI Review of Systems Positive as above Negative for odynophagia, dysphagia, change in bowel habits, melena, hematochezia  Review of Systems General: Denies fevers/chills/weight loss HEENT: Denies oral lesions Cardiovascular: Denies chest pain/palpitations Pulmonary: Denies shortness of breath/cough Gastroenterological: See HPI Genitourinary: Denies darkened urine Hematological: Denies easy bruising/bleeding Dermatological: Denies jaundice Psychological: Mood is stable   Medications Current Outpatient Medications  Medication Sig Dispense Refill  . busPIRone (BUSPAR) 10 MG tablet Take 20 mg by mouth 3 (three) times daily.  0  . Calcium Citrate-Vitamin D (CALCIUM + D PO) Take 2 tablets by mouth daily.     Marland Kitchen denosumab (PROLIA) 60 MG/ML SOSY injection Inject 60 mg into the skin every 6 (six) months.    . fexofenadine (ALLEGRA) 180 MG tablet Take 180 mg by mouth at bedtime.    . gabapentin (NEURONTIN) 300 MG capsule Take 1 tablet every morning, 1 tablet at lunch, 3 tablets every night    . hydrOXYzine (VISTARIL) 25 MG capsule Take 75 mg by mouth at bedtime.    . Misc Natural Products (OSTEO BI-FLEX JOINT SHIELD PO) Take 1 tablet by mouth daily.    . montelukast (SINGULAIR) 10 MG tablet Take 10 mg by mouth at bedtime.    . Multiple Vitamin (MULTIVITAMIN WITH MINERALS) TABS tablet Take 1 tablet by mouth daily.    . pantoprazole (PROTONIX) 40 MG tablet Take 1 tablet (40 mg total) by mouth 2 (two) times daily. 60 tablet 11  . QUEtiapine (SEROQUEL) 50 MG tablet Take 50-100 mg by mouth every morning.     . sertraline (ZOLOFT) 100 MG tablet Take 150 mg by mouth every morning.    . tolterodine (DETROL) 1 MG tablet Take 2 mg by mouth  every other day. On even days     No current facility-administered medications for this visit.    Allergies No Known Allergies  Histories Past Medical History:  Diagnosis Date  . Allergy   . Anxiety   . Arthritis   .  Depression   . GERD (gastroesophageal reflux disease)   . Hearing loss   . Hiatal hernia   . Incontinence   . Osteoporosis   . Overactive bladder   . Restless leg syndrome    Past Surgical History:  Procedure Laterality Date  . BREAST BIOPSY     left- bx only, no lymph nodes removed  . BREAST EXCISIONAL BIOPSY Left   . BUNIONECTOMY  02/2008   left foot. ALSO HAD DISLOCATED TOE & CROOKED TOE  . CERVICAL BIOPSY    . EYE SURGERY  age 66   straightened both eyes  . thumb surgery  02/1997   right  . UPPER GASTROINTESTINAL ENDOSCOPY  04/17/2019   2018   Social History   Socioeconomic History  . Marital status: Single    Spouse name: Not on file  . Number of children: Not on file  . Years of education: Not on file  . Highest education level: Not on file  Occupational History  . Occupation: Retired  Tobacco Use  . Smoking status: Current Some Day Smoker    Packs/day: 1.00  . Smokeless tobacco: Never Used  Substance and Sexual Activity  . Alcohol use: Yes    Alcohol/week: 2.0 standard drinks    Types: 2 Standard drinks or equivalent per week    Comment: 2/DAY 4 DAYS A WEEK  . Drug use: No  . Sexual activity: Not on file  Other Topics Concern  . Not on file  Social History Narrative  . Not on file   Social Determinants of Health   Financial Resource Strain:   . Difficulty of Paying Living Expenses:   Food Insecurity:   . Worried About Charity fundraiser in the Last Year:   . Arboriculturist in the Last Year:   Transportation Needs:   . Film/video editor (Medical):   Marland Kitchen Lack of Transportation (Non-Medical):   Physical Activity:   . Days of Exercise per Week:   . Minutes of Exercise per Session:   Stress:   . Feeling of Stress :   Social Connections:   . Frequency of Communication with Friends and Family:   . Frequency of Social Gatherings with Friends and Family:   . Attends Religious Services:   . Active Member of Clubs or Organizations:   . Attends English as a second language teacher Meetings:   Marland Kitchen Marital Status:   Intimate Partner Violence:   . Fear of Current or Ex-Partner:   . Emotionally Abused:   Marland Kitchen Physically Abused:   . Sexually Abused:    Family History  Problem Relation Age of Onset  . Arthritis Mother   . Stroke Mother   . Diabetes Father   . Heart disease Father   . Hypertension Brother   . Colon polyps Brother   . Colon cancer Paternal Grandfather   . Breast cancer Paternal Aunt   . Heart attack Brother   . Pancreatic cancer Cousin        1st cousin  . Esophageal cancer Neg Hx   . Stomach cancer Neg Hx   . Rectal cancer Neg Hx   . Inflammatory bowel disease Neg Hx   . Liver disease Neg Hx  I have reviewed her medical, social, and family history in detail and updated the electronic medical record as necessary.    PHYSICAL EXAMINATION  BP 98/64   Pulse (!) 110   Temp 98.5 F (36.9 C)   Ht 5\' 2"  (1.575 m)   Wt 161 lb (73 kg)   BMI 29.45 kg/m  Wt Readings from Last 3 Encounters:  04/30/19 161 lb (73 kg)  04/17/19 162 lb 9.6 oz (73.8 kg)  04/02/19 162 lb 9.6 oz (73.8 kg)  GEN: NAD, appears stated age, doesn't appear chronically ill PSYCH: Cooperative, without pressured speech EYE: Conjunctivae pink, sclerae anicteric ENT: MMM, without oral ulcers CV: Tachycardic RESP: No audible wheezing GI: NABS, soft, NT/ND, without rebound or guarding MSK/EXT: No significant lower extremity edema SKIN: No jaundice NEURO:  Alert & Oriented x 3, no focal deficits   REVIEW OF DATA  I reviewed the following data at the time of this encounter:  GI Procedures and Studies  April 2021 EGD - LA Grade C reflux esophagitis with no bleeding. Biopsied. - Esophageal mucosal changes consistent with long-segment Barrett's esophagus. Biopsied. - Medium-sized hiatal hernia. - Mucosal nodule found in the duodenum. Biopsied. Pathology Diagnosis 1. Surgical [P], duodenal bulb nodule - LOW GRADE NEUROENDOCRINE TUMOR (CARCINOID TUMOR). - SEE  COMMENT. 2. Surgical [P], distal esophagus - INFLAMED AND ULCERATED SQUAMOUS MUCOSA. - FOREIGN MATERIAL. - THERE IS NO EVIDENCE OF GOBLET CELL METAPLASIA, DYSPLASIA OR MALIGNANCY.  Laboratory Studies  Reviewed those in epic  Imaging Studies  No relevant studies to review   ASSESSMENT  Ms. Kittleson is a 69 y.o. female with a pmh significant for Barrett's esophagus, hiatal hernia, GERD with esophagitis, arthritis, MDD/anxiety, osteoporosis, restless leg syndrome, recent diagnosis of duodenal neuroendocrine tumor.  The patient is seen today for evaluation and management of:  1. Abnormal endoscopy of upper gastrointestinal tract   2. Benign neuroendocrine tumor of duodenum   3. Barrett's esophagus without dysplasia   4. Gastroesophageal reflux disease with esophagitis without hemorrhage    The patient is clinically and hemodynamically stable at this time.  She has had tremendous improvement in the setting of her nausea and vomiting and GERD symptoms now that she is back on PPI therapy.  The endoscopic evaluation showed evidence of a duodenal neuroendocrine tumor.  She will benefit from an endoscopic ultrasound as well as potential attempt at resection.  We will evaluate for lymph node disease.  If no evidence of significant lymph node disease, then can move forward with an attempt at resection via EMR of the duodenal Bulb Nodule.  Likely will be a cap assisted or band ligation assisted resection.  The risks of EUS including bleeding, infection, aspiration pneumonia and intestinal perforation were discussed as was the possibility it may not give a definitive diagnosis.  If a biopsy of the pancreas is done as part of the EUS, there is an additional risk of pancreatitis at the rate of about 1%, this is not anticipated.  The risks and benefits of endoscopic evaluation were discussed with the patient; these include but are not limited to the risk of perforation, infection, bleeding, missed lesions, lack  of diagnosis, severe illness requiring hospitalization, as well as anesthesia and sedation related illnesses.  The patient is agreeable to proceed.  The patient has already obtained laboratories this morning to evaluate for chromogranin a level.  This may be elevated in the setting of her PPI use but we will see what it shows and except that she needs  her PPI at this time undoubtedly.  We will obtain some of the labs that were done at her primary care provider and in case she needs additional preprocedure labs (CBC/BMP/INR) then we will work on obtaining those prior to her resection.  I think waiting at least 6 to 8 weeks from her EGD to allow healing of the esophagus will be helpful because I can also obtain Barrett's biopsies at that time.  We also spent time this afternoon discussing Barrett's esophagus and the implications for her from having this disorder (although recent biopsies only showed esophagitis).   All patient questions were answered, to the best of my ability, and the patient agrees to the aforementioned plan of action with follow-up as indicated.   PLAN  Proceed with scheduling 90-minute EUS with EMR approximately 6 to 8 weeks from endoscopy Follow-up chromogranin a level If PCP records do not have preprocedure labs that we would need then we will proceed with a CBC/BMP/INR in the coming weeks   Orders Placed This Encounter  Procedures  . Procedural/ Surgical Case Request: UPPER ESOPHAGEAL ENDOSCOPIC ULTRASOUND (EUS), ENDOSCOPIC MUCOSAL RESECTION  . Ambulatory referral to Gastroenterology    New Prescriptions   No medications on file   Modified Medications   No medications on file    Planned Follow Up No follow-ups on file.   Total Time in Face-to-Face and in Coordination of Care for patient including independent/personal interpretation/review of prior testing, medical history, examination, medication adjustment, communicating results with the patient directly, and  documentation with the EHR is 30 minutes.   Justice Britain, MD [facility] Office # [phone]

## 2019-05-08 LAB — CHROMOGRANIN A REBASELINE
Chromogranin A, LC/MS/MS: 2843 ng/mL — ABNORMAL HIGH (ref <3)
Chromogranin A: 1147 ng/mL — ABNORMAL HIGH (ref 25–140)

## 2019-05-14 ENCOUNTER — Other Ambulatory Visit: Payer: Self-pay

## 2019-05-14 DIAGNOSIS — R899 Unspecified abnormal finding in specimens from other organs, systems and tissues: Secondary | ICD-10-CM

## 2019-05-14 DIAGNOSIS — D3A8 Other benign neuroendocrine tumors: Secondary | ICD-10-CM

## 2019-05-19 ENCOUNTER — Ambulatory Visit (HOSPITAL_COMMUNITY)
Admission: RE | Admit: 2019-05-19 | Discharge: 2019-05-19 | Disposition: A | Discharge status: Home / Self Care | Payer: PPO | Source: Ambulatory Visit | Attending: Gastroenterology | Admitting: Gastroenterology

## 2019-05-19 ENCOUNTER — Encounter (HOSPITAL_COMMUNITY): Payer: Self-pay

## 2019-05-19 ENCOUNTER — Other Ambulatory Visit: Payer: Self-pay

## 2019-05-19 DIAGNOSIS — D3A010 Benign carcinoid tumor of the duodenum: Secondary | ICD-10-CM | POA: Diagnosis not present

## 2019-05-19 DIAGNOSIS — D3A8 Other benign neuroendocrine tumors: Secondary | ICD-10-CM

## 2019-05-19 DIAGNOSIS — R899 Unspecified abnormal finding in specimens from other organs, systems and tissues: Secondary | ICD-10-CM | POA: Diagnosis not present

## 2019-05-19 DIAGNOSIS — N2889 Other specified disorders of kidney and ureter: Secondary | ICD-10-CM | POA: Diagnosis not present

## 2019-05-19 LAB — POCT I-STAT CREATININE: Creatinine, Ser: 0.9 mg/dL (ref 0.44–1.00)

## 2019-05-19 MED ORDER — IOHEXOL 300 MG/ML  SOLN
100.0000 mL | Freq: Once | INTRAMUSCULAR | Status: AC | PRN
  Administered 2019-05-19: 100 mL via INTRAVENOUS

## 2019-05-19 MED ORDER — SODIUM CHLORIDE (PF) 0.9 % IJ SOLN
INTRAMUSCULAR | Status: AC
  Filled 2019-05-19: qty 50

## 2019-05-20 DIAGNOSIS — M19049 Primary osteoarthritis, unspecified hand: Secondary | ICD-10-CM | POA: Diagnosis not present

## 2019-05-20 DIAGNOSIS — M6780 Other specified disorders of synovium and tendon, unspecified site: Secondary | ICD-10-CM | POA: Diagnosis not present

## 2019-05-21 ENCOUNTER — Telehealth: Payer: Self-pay | Admitting: Gastroenterology

## 2019-05-21 ENCOUNTER — Other Ambulatory Visit: Payer: Self-pay

## 2019-05-21 DIAGNOSIS — N289 Disorder of kidney and ureter, unspecified: Secondary | ICD-10-CM

## 2019-05-21 MED ORDER — LORAZEPAM 0.5 MG PO TABS: 0.5000 mg | ORAL_TABLET | ORAL | 0 refills | Status: AC

## 2019-05-21 MED ORDER — LORAZEPAM 0.5 MG PO TABS
0.5000 mg | ORAL_TABLET | ORAL | 0 refills | Status: DC
Start: 2019-05-21 — End: 2019-05-21

## 2019-05-21 NOTE — Telephone Encounter (Signed)
CT faxed to PCP and printed and mailed to the pt Prescription faxed MRI scheduled at [facility] on 06/03/19 7 am

## 2019-05-21 NOTE — Telephone Encounter (Signed)
I called and spoke with the patient this morning about the results of her CT abdomen/pelvis which we pursued because of the elevated chromogranin A level. There is a small lymph node in the region of her significant esophagitis that likely is going to turn out to be inflammatory but will be something that I will be looking at more closely at time of EGD/EUS.  No other significant findings from the GI tract perspective. There were some pulmonary nodules that will need some follow-up and potential other imaging that will be deferred to her primary care doctor. However, there is a lesion in the left kidney that is suspicious for potential renal cell carcinoma. Further imaging has been recommended by radiology. I spoke with the patient and she agrees to move forward with additional imaging. I am recommending a MRI abdomen renal protocol with and without contrast to be performed. Patient will likely need some Ativan to help with claustrophobia.  We will prescribe her Ativan 0.5 mg x 2 tablets for which she can take 1 tablet a couple hours before her imaging study and then another point 5 mg that can be available should she need it at the day of her procedure after she checks sent to radiology. I will forward the results of her CT scan to her primary GI, Dr. Fuller Plan. I will forward the results of her CT scan to her primary care provider, Dr. Ashby Dawes. I will try to add on the patient to our [facility] conference in the next 2 weeks to see about further consideration of dotatate prior to EUS/EGD with possible EMR versus considering that after the fact. We will maintain her current date for her procedures. Patient appreciative for call back  Patty please move forward as follows: 1) schedule MRI abdomen with/without contrast renal protocol 2) Ativan as documented above 3) forward/fax her primary care doctor the results since he is not in the epic system for [place] to forward 4) please send the patient the results  via letter for her records (she does not know how to use MyChart)  Malachy Mood can you please try and add on this patient to [facility] to June 2 list?  Thanks. GM

## 2019-05-21 NOTE — Telephone Encounter (Signed)
Left message on machine to call back  

## 2019-05-22 NOTE — Telephone Encounter (Signed)
Reviewed note and plans outlined. Thank you.

## 2019-05-22 NOTE — Telephone Encounter (Signed)
Spoke with pt and she is aware of MRI and knows how to take the ativan.

## 2019-05-22 NOTE — Telephone Encounter (Signed)
Pt returning your call

## 2019-06-03 ENCOUNTER — Other Ambulatory Visit: Payer: Self-pay

## 2019-06-03 ENCOUNTER — Ambulatory Visit (HOSPITAL_COMMUNITY)
Admission: RE | Admit: 2019-06-03 | Discharge: 2019-06-03 | Disposition: A | Discharge status: Home / Self Care | Payer: PPO | Source: Ambulatory Visit | Attending: Gastroenterology | Admitting: Gastroenterology

## 2019-06-03 ENCOUNTER — Telehealth: Payer: Self-pay | Admitting: Gastroenterology

## 2019-06-03 DIAGNOSIS — N289 Disorder of kidney and ureter, unspecified: Secondary | ICD-10-CM | POA: Diagnosis not present

## 2019-06-03 DIAGNOSIS — K449 Diaphragmatic hernia without obstruction or gangrene: Secondary | ICD-10-CM | POA: Diagnosis not present

## 2019-06-03 DIAGNOSIS — N2889 Other specified disorders of kidney and ureter: Secondary | ICD-10-CM | POA: Diagnosis not present

## 2019-06-03 MED ORDER — GADOBUTROL 1 MMOL/ML IV SOLN
7.0000 mL | Freq: Once | INTRAVENOUS | Status: AC | PRN
  Administered 2019-06-03: 7 mL via INTRAVENOUS

## 2019-06-03 NOTE — Telephone Encounter (Signed)
Case discussed at [facility]. Radiology and Pathology reviewed. Consensus is with the elevated Chromogranin, much higher than would be expected for a single duodenal nodule, that we should proceed with a DOTATATE scan prior to resection. If negative, then proceed with EUS/EGD with EMR would be reasonable. If positive then would need additional workup. The lesion in the kidney is concerning for RCC and may require a MRI-Abdomen v CTAP dedicated with/without Contrast to better clarify the lesion.  We have already ordered the MRI and she actually had this completed today but it is not available for review. There is a small off chance that this is a rare NET of the kidney. I was able to speak with the patient today. She understands the need for postponing next week's procedure. We will proceed as follows: 1) cancel EUS/EGD with EMR next week 2) cancel Covid testing 3) proceed with scheduling dotatate scan for indication neuroendocrine tumor and elevated chromogranin A 4) forward information to patient's primary GI and primary care provider 5) after dotatate scan returns we will decide on need for EUS/EGD with EMR versus need for other referrals 6) follow-up MRI abdomen when results return  Patty please move forward as able. Thanks. GM

## 2019-06-04 ENCOUNTER — Other Ambulatory Visit: Payer: Self-pay

## 2019-06-04 ENCOUNTER — Other Ambulatory Visit (HOSPITAL_COMMUNITY): Payer: PPO

## 2019-06-04 DIAGNOSIS — D3A8 Other benign neuroendocrine tumors: Secondary | ICD-10-CM

## 2019-06-04 DIAGNOSIS — R899 Unspecified abnormal finding in specimens from other organs, systems and tissues: Secondary | ICD-10-CM

## 2019-06-04 NOTE — Telephone Encounter (Signed)
Amy this pt needs a dototate scan but I can't schedule until I get the prior auth.  Can you check into this for me?

## 2019-06-04 NOTE — Telephone Encounter (Signed)
Dototate scan 06/18/19 at 3 pm NPO 2 hours arrive at 230 pm

## 2019-06-04 NOTE — Telephone Encounter (Signed)
Referral has already been authorized.  She has [employer].  They do not need an [place] with that insurance. She can be scheduled.

## 2019-06-04 NOTE — Telephone Encounter (Signed)
The patient has been notified of this information and all questions answered.  Appts cancelled and information sent to PCP

## 2019-06-05 ENCOUNTER — Telehealth: Payer: Self-pay

## 2019-06-05 ENCOUNTER — Other Ambulatory Visit: Payer: Self-pay

## 2019-06-05 DIAGNOSIS — N2889 Other specified disorders of kidney and ureter: Secondary | ICD-10-CM

## 2019-06-05 NOTE — Telephone Encounter (Signed)
Referral made to [facility].    nt, Molinda Bailiff, RN  Timothy Lasso, RN      Previous Messages   ----- Message -----  From: Irving Copas., MD  Sent: 06/05/2019  4:11 PM EDT  To: Algernon Huxley, RN  Subject: RE: Referral                   Vaughan Basta, That is fine by me if urology is the first person for the patient to see. Thanks. GM     ----- Message -----  From: Algernon Huxley, RN  Sent: 06/05/2019  2:45 PM EDT  To: Irving Copas., MD  Subject: FW: Referral                   Dr. Rush Landmark,   Oncology referral placed but per oncology pt should be referred to [facility] urology for workup and then they will refer the pt to oncology.   Please advise.  Thanks,  Vaughan Basta  ----- Message -----  From: Nena Polio  Sent: 06/05/2019  2:38 PM EDT  To: Algernon Huxley, RN  Subject: Referral                     Hello!   Pt needs to be referred to [facility] for workup. They will then refer the pt to our office.   Thanks  Seth Bake       Referral

## 2019-06-08 ENCOUNTER — Ambulatory Visit (HOSPITAL_COMMUNITY): Admission: RE | Admit: 2019-06-08 | Payer: PPO | Source: Home / Self Care | Admitting: Gastroenterology

## 2019-06-08 ENCOUNTER — Encounter (HOSPITAL_COMMUNITY): Admission: RE | Payer: Self-pay | Source: Home / Self Care

## 2019-06-08 SURGERY — UPPER ESOPHAGEAL ENDOSCOPIC ULTRASOUND (EUS)
Anesthesia: Monitor Anesthesia Care

## 2019-06-11 DIAGNOSIS — M810 Age-related osteoporosis without current pathological fracture: Secondary | ICD-10-CM | POA: Diagnosis not present

## 2019-06-18 ENCOUNTER — Other Ambulatory Visit: Payer: Self-pay

## 2019-06-18 ENCOUNTER — Ambulatory Visit (HOSPITAL_COMMUNITY)
Admission: RE | Admit: 2019-06-18 | Discharge: 2019-06-18 | Disposition: A | Discharge status: Home / Self Care | Payer: PPO | Source: Ambulatory Visit | Attending: Gastroenterology | Admitting: Gastroenterology

## 2019-06-18 DIAGNOSIS — R899 Unspecified abnormal finding in specimens from other organs, systems and tissues: Secondary | ICD-10-CM | POA: Insufficient documentation

## 2019-06-18 DIAGNOSIS — D3A010 Benign carcinoid tumor of the duodenum: Secondary | ICD-10-CM | POA: Insufficient documentation

## 2019-06-18 DIAGNOSIS — C7A010 Malignant carcinoid tumor of the duodenum: Secondary | ICD-10-CM | POA: Diagnosis not present

## 2019-06-18 DIAGNOSIS — D3A8 Other benign neuroendocrine tumors: Secondary | ICD-10-CM

## 2019-06-18 MED ORDER — GALLIUM GA 68 DOTATATE IV KIT
3.7200 | PACK | Freq: Once | INTRAVENOUS | Status: AC | PRN
  Administered 2019-06-18: 3.72 via INTRAVENOUS

## 2019-07-28 DIAGNOSIS — F3342 Major depressive disorder, recurrent, in full remission: Secondary | ICD-10-CM | POA: Diagnosis not present

## 2019-07-29 DIAGNOSIS — D4102 Neoplasm of uncertain behavior of left kidney: Secondary | ICD-10-CM | POA: Diagnosis not present

## 2019-08-04 ENCOUNTER — Other Ambulatory Visit: Payer: Self-pay

## 2019-08-04 ENCOUNTER — Telehealth: Payer: Self-pay

## 2019-08-04 DIAGNOSIS — D3A010 Benign carcinoid tumor of the duodenum: Secondary | ICD-10-CM

## 2019-08-04 DIAGNOSIS — D3A8 Other benign neuroendocrine tumors: Secondary | ICD-10-CM

## 2019-08-04 NOTE — Telephone Encounter (Signed)
EUS scheduled, pt instructed and medications reviewed.  Patient instructions mailed to home.  Patient to call with any questions or concerns.  

## 2019-08-04 NOTE — Telephone Encounter (Signed)
The pt has been scheduled for EUS EMR at [facility] with Dr Rush Landmark at 9 am.  COVID test on 9/25.  Left message on machine to call back

## 2019-08-04 NOTE — Telephone Encounter (Signed)
-----   Message from Timothy Lasso, RN sent at 06/19/2019  1:12 PM EDT -----  would like the patient to be put back on my schedule for EUS/EMR next available in July/August/September.

## 2019-08-17 ENCOUNTER — Telehealth: Payer: Self-pay

## 2019-08-17 NOTE — Telephone Encounter (Signed)
-----   Message from Timothy Lasso, RN sent at 06/19/2019 12:56 PM EDT ----- I would like the patient to be put back on my schedule for EUS/EMR next available in July/August/September.

## 2019-08-17 NOTE — Telephone Encounter (Signed)
The pt has been scheduled and instructed.  See alternate note

## 2019-09-26 ENCOUNTER — Other Ambulatory Visit (HOSPITAL_COMMUNITY): Admission: RE | Admit: 2019-09-26 | Payer: PPO | Source: Ambulatory Visit

## 2019-09-28 DIAGNOSIS — D49512 Neoplasm of unspecified behavior of left kidney: Secondary | ICD-10-CM | POA: Diagnosis not present

## 2019-09-29 ENCOUNTER — Other Ambulatory Visit (HOSPITAL_COMMUNITY)
Admission: RE | Admit: 2019-09-29 | Discharge: 2019-09-29 | Disposition: A | Discharge status: Home / Self Care | Payer: PPO | Source: Ambulatory Visit | Attending: Gastroenterology | Admitting: Gastroenterology

## 2019-09-29 ENCOUNTER — Telehealth: Payer: Self-pay | Admitting: Gastroenterology

## 2019-09-29 DIAGNOSIS — Z01812 Encounter for preprocedural laboratory examination: Secondary | ICD-10-CM | POA: Diagnosis not present

## 2019-09-29 DIAGNOSIS — Z20822 Contact with and (suspected) exposure to covid-19: Secondary | ICD-10-CM | POA: Diagnosis not present

## 2019-09-29 LAB — SARS CORONAVIRUS 2 (TAT 6-24 HRS): SARS Coronavirus 2: NEGATIVE

## 2019-09-29 NOTE — Progress Notes (Signed)
Left message with patient regarding procedure scheduled tomorrow and that she needs to have a covid test today prior to the procedure. Asked her to call us back [phone] regarding this.

## 2019-09-29 NOTE — Telephone Encounter (Signed)
The pt has been advised to head to [facility] now for COVID testing for appt tomorrow for EUS The pt has been advised of the information and verbalized understanding.

## 2019-09-30 ENCOUNTER — Ambulatory Visit (HOSPITAL_COMMUNITY)
Admission: RE | Admit: 2019-09-30 | Discharge: 2019-09-30 | Disposition: A | Discharge status: Home / Self Care | Payer: PPO | Attending: Gastroenterology | Admitting: Gastroenterology

## 2019-09-30 ENCOUNTER — Ambulatory Visit (HOSPITAL_COMMUNITY): Payer: PPO | Admitting: Anesthesiology

## 2019-09-30 ENCOUNTER — Encounter (HOSPITAL_COMMUNITY): Payer: Self-pay | Admitting: Gastroenterology

## 2019-09-30 ENCOUNTER — Other Ambulatory Visit: Payer: Self-pay

## 2019-09-30 ENCOUNTER — Encounter (HOSPITAL_COMMUNITY)
Admission: RE | Disposition: A | Discharge status: Home / Self Care | Payer: Self-pay | Source: Home / Self Care | Attending: Gastroenterology

## 2019-09-30 DIAGNOSIS — K3189 Other diseases of stomach and duodenum: Secondary | ICD-10-CM | POA: Diagnosis not present

## 2019-09-30 DIAGNOSIS — I899 Noninfective disorder of lymphatic vessels and lymph nodes, unspecified: Secondary | ICD-10-CM | POA: Diagnosis not present

## 2019-09-30 DIAGNOSIS — K449 Diaphragmatic hernia without obstruction or gangrene: Secondary | ICD-10-CM | POA: Diagnosis not present

## 2019-09-30 DIAGNOSIS — E669 Obesity, unspecified: Secondary | ICD-10-CM | POA: Insufficient documentation

## 2019-09-30 DIAGNOSIS — J449 Chronic obstructive pulmonary disease, unspecified: Secondary | ICD-10-CM | POA: Diagnosis not present

## 2019-09-30 DIAGNOSIS — F17210 Nicotine dependence, cigarettes, uncomplicated: Secondary | ICD-10-CM | POA: Diagnosis not present

## 2019-09-30 DIAGNOSIS — K222 Esophageal obstruction: Secondary | ICD-10-CM | POA: Insufficient documentation

## 2019-09-30 DIAGNOSIS — D3A8 Other benign neuroendocrine tumors: Secondary | ICD-10-CM

## 2019-09-30 DIAGNOSIS — K2100 Gastro-esophageal reflux disease with esophagitis, without bleeding: Secondary | ICD-10-CM | POA: Insufficient documentation

## 2019-09-30 DIAGNOSIS — H9190 Unspecified hearing loss, unspecified ear: Secondary | ICD-10-CM | POA: Diagnosis not present

## 2019-09-30 DIAGNOSIS — K209 Esophagitis, unspecified without bleeding: Secondary | ICD-10-CM | POA: Diagnosis not present

## 2019-09-30 DIAGNOSIS — Z6831 Body mass index (BMI) 31.0-31.9, adult: Secondary | ICD-10-CM | POA: Diagnosis not present

## 2019-09-30 DIAGNOSIS — K219 Gastro-esophageal reflux disease without esophagitis: Secondary | ICD-10-CM | POA: Diagnosis not present

## 2019-09-30 HISTORY — PX: ESOPHAGOGASTRODUODENOSCOPY: SHX5428

## 2019-09-30 HISTORY — PX: BIOPSY: SHX5522

## 2019-09-30 HISTORY — PX: EUS: SHX5427

## 2019-09-30 HISTORY — PX: POLYPECTOMY: SHX5525

## 2019-09-30 HISTORY — PX: ENDOSCOPIC MUCOSAL RESECTION: SHX6839

## 2019-09-30 HISTORY — DX: Other complications of anesthesia, initial encounter: T88.59XA

## 2019-09-30 HISTORY — PX: HEMOSTASIS CLIP PLACEMENT: SHX6857

## 2019-09-30 SURGERY — UPPER ENDOSCOPIC ULTRASOUND (EUS) RADIAL
Anesthesia: Monitor Anesthesia Care

## 2019-09-30 MED ORDER — SODIUM CHLORIDE 0.9 % IV SOLN: INTRAVENOUS | Status: DC

## 2019-09-30 MED ORDER — PROPOFOL 500 MG/50ML IV EMUL
INTRAVENOUS | Status: AC
  Filled 2019-09-30: qty 50

## 2019-09-30 MED ORDER — ONDANSETRON HCL 4 MG/2ML IJ SOLN
INTRAMUSCULAR | Status: DC | PRN
  Administered 2019-09-30: 4 mg via INTRAVENOUS

## 2019-09-30 MED ORDER — PROPOFOL 500 MG/50ML IV EMUL
INTRAVENOUS | Status: DC | PRN
  Administered 2019-09-30: 125 ug/kg/min via INTRAVENOUS

## 2019-09-30 MED ORDER — SUCRALFATE 1 GM/10ML PO SUSP: 1.0000 g | Freq: Two times a day (BID) | ORAL | 1 refills | Status: AC

## 2019-09-30 MED ORDER — LACTATED RINGERS IV SOLN
INTRAVENOUS | Status: DC
  Administered 2019-09-30: 1000 mL via INTRAVENOUS

## 2019-09-30 MED ORDER — LIDOCAINE 2% (20 MG/ML) 5 ML SYRINGE
INTRAMUSCULAR | Status: DC | PRN
  Administered 2019-09-30: 60 mg via INTRAVENOUS

## 2019-09-30 MED ORDER — PROPOFOL 10 MG/ML IV BOLUS
INTRAVENOUS | Status: DC | PRN
  Administered 2019-09-30 (×3): 20 mg via INTRAVENOUS
  Administered 2019-09-30: 30 mg via INTRAVENOUS

## 2019-09-30 NOTE — H&P (Signed)
GASTROENTEROLOGY PROCEDURE H&P NOTE   Primary Care Physician: Merrilee Seashore, MD  HPI: Kathleen Vasquez is a 69 y.o. 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  Past Medical History:  Diagnosis Date  . Allergy   . Anxiety   . Arthritis   . Complication of anesthesia    states seizure with anesthesia1999, dog bite, had alcohol prior to bite  . Depression   . GERD (gastroesophageal reflux disease)   . Hearing loss   . Hiatal hernia   . Incontinence   . Osteoporosis   . Overactive bladder   . Restless leg syndrome    Past Surgical History:  Procedure Laterality Date  . BREAST BIOPSY     left- bx only, no lymph nodes removed  . BREAST EXCISIONAL BIOPSY Left   . BUNIONECTOMY  02/2008   left foot. ALSO HAD DISLOCATED TOE & CROOKED TOE  . CERVICAL BIOPSY    . EYE SURGERY  age 40   straightened both eyes  . thumb surgery  02/1997   right  . UPPER GASTROINTESTINAL ENDOSCOPY  04/17/2019   2018   Current Facility-Administered Medications  Medication Dose Route Frequency Provider Last Rate Last Admin  . 0.9 %  sodium chloride infusion   Intravenous Continuous Mansouraty, Telford Nab., MD      . lactated ringers infusion   Intravenous Continuous Mansouraty, Telford Nab., MD 125 mL/hr at 09/30/19 0847 Continued from Pre-op at 09/30/19 0847   No Known Allergies Family History  Problem Relation Age of Onset  . Arthritis Mother   . Stroke Mother   . Diabetes Father   . Heart disease Father   . Hypertension Brother   . Colon polyps Brother   . Colon cancer Paternal Grandfather   . Breast cancer Paternal Aunt   . Heart attack Brother   . Pancreatic cancer Cousin        1st cousin  . Esophageal cancer Neg Hx   . Stomach cancer Neg Hx   . Rectal cancer Neg Hx   . Inflammatory bowel disease Neg Hx   . Liver disease Neg Hx    Social History   Socioeconomic History  . Marital status: Single    Spouse name: Not on file  . Number of children:  Not on file  . Years of education: Not on file  . Highest education level: Not on file  Occupational History  . Occupation: Retired  Tobacco Use  . Smoking status: Current Some Day Smoker    Packs/day: 1.00  . Smokeless tobacco: Never Used  . Tobacco comment: not in months  Vaping Use  . Vaping Use: Never assessed  Substance and Sexual Activity  . Alcohol use: Yes    Alcohol/week: 2.0 standard drinks    Types: 2 Standard drinks or equivalent per week    Comment: 2/DAY 4 DAYS A WEEK  . Drug use: No  . Sexual activity: Not on file  Other Topics Concern  . Not on file  Social History Narrative  . Not on file   Social Determinants of Health   Financial Resource Strain:   . Difficulty of Paying Living Expenses: Not on file  Food Insecurity:   . Worried About Charity fundraiser in the Last Year: Not on file  . Ran Out of Food in the Last Year: Not on file  Transportation Needs:   . Lack of Transportation (Medical): Not on file  . Lack of Transportation (Non-Medical):  Not on file  Physical Activity:   . Days of Exercise per Week: Not on file  . Minutes of Exercise per Session: Not on file  Stress:   . Feeling of Stress : Not on file  Social Connections:   . Frequency of Communication with Friends and Family: Not on file  . Frequency of Social Gatherings with Friends and Family: Not on file  . Attends Religious Services: Not on file  . Active Member of Clubs or Organizations: Not on file  . Attends Archivist Meetings: Not on file  . Marital Status: Not on file  Intimate Partner Violence:   . Fear of Current or Ex-Partner: Not on file  . Emotionally Abused: Not on file  . Physically Abused: Not on file  . Sexually Abused: Not on file    Physical Exam: Vital signs in last 24 hours: Temp:  [98.3 F (36.8 C)] 98.3 F (36.8 C) (09/29 0813) Pulse Rate:  [104] 104 (09/29 0813) Resp:  [17] 17 (09/29 0813) BP: (149)/(82) 149/82 (09/29 0813) SpO2:  [97 %] 97 %  (09/29 0813) Weight:  [77.1 kg] 77.1 kg (09/29 0813)   GEN: NAD EYE: Sclerae anicteric ENT: MMM CV: Non-tachycardic GI: Soft, NT/ND NEURO:  Alert & Oriented x 3  Lab Results: No results for input(s): WBC, HGB, HCT, PLT in the last 72 hours. BMET No results for input(s): NA, K, CL, CO2, GLUCOSE, BUN, CREATININE, CALCIUM in the last 72 hours. LFT No results for input(s): PROT, ALBUMIN, AST, ALT, ALKPHOS, BILITOT, BILIDIR, IBILI in the last 72 hours. PT/INR No results for input(s): LABPROT, INR in the last 72 hours.   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  The risks of an EUS including intestinal perforation, bleeding, infection, aspiration, and medication effects were discussed as was the possibility it may not give a definitive diagnosis if a biopsy is performed.  When a biopsy of the pancreas is done as part of the EUS, there is an additional risk of pancreatitis at the rate of about 1-2%.  It was explained that procedure related pancreatitis is typically mild, although it can be severe and even life threatening, which is why we do not perform random pancreatic biopsies and only biopsy a lesion/area we feel is concerning enough to warrant the risk.  The risks and benefits of endoscopic evaluation were discussed with the patient; these include but are not limited to the risk of perforation, infection, bleeding, missed lesions, lack of diagnosis, severe illness requiring hospitalization, as well as anesthesia and sedation related illnesses.  The patient is agreeable to proceed.    Justice Britain, MD [facility]oscopy Office # [phone]

## 2019-09-30 NOTE — Transfer of Care (Signed)
Immediate Anesthesia Transfer of Care Note  Patient: Kathleen Vasquez  Procedure(s) Performed: UPPER ENDOSCOPIC ULTRASOUND (EUS) RADIAL (N/A ) ENDOSCOPIC MUCOSAL RESECTION (N/A ) ESOPHAGOGASTRODUODENOSCOPY (EGD) (N/A ) POLYPECTOMY BIOPSY HEMOSTASIS CLIP PLACEMENT  Patient Location: PACU  Anesthesia Type:MAC  Level of Consciousness: sedated  Airway & Oxygen Therapy: Patient Spontanous Breathing and Patient connected to face mask oxygen  Post-op Assessment: Report given to RN and Post -op Vital signs reviewed and stable  Post vital signs: Reviewed and stable  Last Vitals:  Vitals Value Taken Time  BP    Temp    Pulse 92 09/30/19 1115  Resp 14 09/30/19 1115  SpO2 99 % 09/30/19 1115  Vitals shown include unvalidated device data.  Last Pain:  Vitals:   09/30/19 0813  TempSrc: Oral  PainSc: 0-No pain         Complications: No complications documented.

## 2019-09-30 NOTE — Discharge Instructions (Signed)
Upper Endoscopy, Adult, Care After  This sheet gives you information about how to care for yourself after your procedure. Your health care provider may also give you more specific instructions. If you have problems or questions, contact your health care provider.  What can I expect after the procedure?  After the procedure, it is common to have:  · A sore throat.  · Mild stomach pain or discomfort.  · Bloating.  · Nausea.  Follow these instructions at home:    · Follow instructions from your health care provider about what to eat or drink after your procedure.  · Return to your normal activities as told by your health care provider. Ask your health care provider what activities are safe for you.  · Take over-the-counter and prescription medicines only as told by your health care provider.  · Do not drive for 24 hours if you were given a sedative during your procedure.  · Keep all follow-up visits as told by your health care provider. This is important.  Contact a health care provider if you have:  · A sore throat that lasts longer than one day.  · Trouble swallowing.  Get help right away if:  · You vomit blood or your vomit looks like coffee grounds.  · You have:  ? A fever.  ? Bloody, black, or tarry stools.  ? A severe sore throat or you cannot swallow.  ? Difficulty breathing.  ? Severe pain in your chest or abdomen.  Summary  · After the procedure, it is common to have a sore throat, mild stomach discomfort, bloating, and nausea.  · Do not drive for 24 hours if you were given a sedative during the procedure.  · Follow instructions from your health care provider about what to eat or drink after your procedure.  · Return to your normal activities as told by your health care provider.  This information is not intended to replace advice given to you by your health care provider. Make sure you discuss any questions you have with your health care provider.  Document Revised: 06/11/2017 Document Reviewed:  05/20/2017  Elsevier Patient Education © 2020 [employer]

## 2019-09-30 NOTE — Anesthesia Postprocedure Evaluation (Signed)
Anesthesia Post Note  Patient: Kathleen Vasquez  Procedure(s) Performed: UPPER ENDOSCOPIC ULTRASOUND (EUS) RADIAL (N/A ) ENDOSCOPIC MUCOSAL RESECTION (N/A ) ESOPHAGOGASTRODUODENOSCOPY (EGD) (N/A ) POLYPECTOMY BIOPSY HEMOSTASIS CLIP PLACEMENT     Patient location during evaluation: Endoscopy Anesthesia Type: MAC Level of consciousness: awake and alert, patient cooperative and oriented Pain management: pain level controlled Vital Signs Assessment: post-procedure vital signs reviewed and stable Respiratory status: nonlabored ventilation, spontaneous breathing and respiratory function stable Cardiovascular status: stable and blood pressure returned to baseline Postop Assessment: no apparent nausea or vomiting Anesthetic complications: no   No complications documented.  Last Vitals:  Vitals:   09/30/19 1120 09/30/19 1130  BP: 127/77 (!) 150/88  Pulse: 90 92  Resp: 14 17  Temp: (!) 35.7 C   SpO2: 99% 95%    Last Pain:  Vitals:   09/30/19 1120  TempSrc: Temporal  PainSc:                  Lyndon Chenoweth,E. Marwin Primmer

## 2019-09-30 NOTE — Anesthesia Procedure Notes (Signed)
Date/Time: 09/30/2019 9:42 AM Performed by: Talbot Grumbling, CRNA Pre-anesthesia Checklist: Patient identified, Emergency Drugs available, Suction available and Patient being monitored Patient Re-evaluated:Patient Re-evaluated prior to induction Oxygen Delivery Method: Simple face mask

## 2019-09-30 NOTE — Anesthesia Preprocedure Evaluation (Addendum)
Anesthesia Evaluation  Patient identified by MRN, date of birth, ID band Patient awake    Reviewed: Allergy & Precautions, NPO status , Patient's Chart, lab work & pertinent test results  History of Anesthesia Complications Negative for: history of anesthetic complications  Airway Mallampati: II  TM Distance: >3 FB Neck ROM: Full    Dental  (+) Poor Dentition, Missing, Loose, Dental Advisory Given   Pulmonary COPD,  COPD inhaler, Current Smoker and Patient abstained from smoking.,  09/29/2019 SARS coronavirus NEG   breath sounds clear to auscultation       Cardiovascular negative cardio ROS   Rhythm:Regular Rate:Normal     Neuro/Psych Anxiety Depression negative neurological ROS     GI/Hepatic GERD  Medicated and Controlled,(+)     substance abuse  alcohol use, Duodenal neuroendocrine tumor   Endo/Other  obese  Renal/GU negative Renal ROS     Musculoskeletal  (+) Arthritis ,   Abdominal (+) + obese,   Peds  Hematology negative hematology ROS (+)   Anesthesia Other Findings   Reproductive/Obstetrics                            Anesthesia Physical Anesthesia Plan  ASA: III  Anesthesia Plan: MAC   Post-op Pain Management:    Induction:   PONV Risk Score and Plan: 1 and Treatment may vary due to age or medical condition and Ondansetron  Airway Management Planned: Natural Airway and Nasal Cannula  Additional Equipment: None  Intra-op Plan:   Post-operative Plan:   Informed Consent: I have reviewed the patients History and Physical, chart, labs and discussed the procedure including the risks, benefits and alternatives for the proposed anesthesia with the patient or authorized representative who has indicated his/her understanding and acceptance.     Dental advisory given  Plan Discussed with: CRNA and Surgeon  Anesthesia Plan Comments:        Anesthesia Quick  Evaluation

## 2019-09-30 NOTE — Op Note (Signed)
[facility] Patient Name: Kathleen Vasquez Procedure Date: 09/30/2019 MRN: [id number] Attending MD: Justice Britain , MD Date of Birth: 1950-02-20 CSN: [id number] Age: 69 Admit Type: Outpatient Procedure:                Upper EUS Indications:              Duodenal deformity on endoscopy/Subepithelial tumor                            vs. extrinsic compression, Follow-up of                            esophagitis, Neuroendocrine Tumor Providers:                Justice Britain, MD, Baird Cancer, RN, Ladona Ridgel, Technician Referring MD:             Pricilla Riffle. Fuller Plan, MD Medicines:                Monitored Anesthesia Care Complications:            No immediate complications. Estimated Blood Loss:     Estimated blood loss was minimal. Procedure:                Pre-Anesthesia Assessment:                           - Prior to the procedure, a History and Physical                            was performed, and patient medications and                            allergies were reviewed. The patient's tolerance of                            previous anesthesia was also reviewed. The risks                            and benefits of the procedure and the sedation                            options and risks were discussed with the patient.                            All questions were answered, and informed consent                            was obtained. Prior Anticoagulants: The patient has                            taken no previous anticoagulant or antiplatelet                            agents. ASA Grade  Assessment: III - A patient with                            severe systemic disease. After reviewing the risks                            and benefits, the patient was deemed in                            satisfactory condition to undergo the procedure.                           After obtaining informed consent, the endoscope was                             passed under direct vision. Throughout the                            procedure, the patient's blood pressure, pulse, and                            oxygen saturations were monitored continuously. The                            [id number] ([id number]) Olympus therapeutic endoscope                            was introduced through the mouth, and advanced to                            the second part of duodenum. There was difficulty                            in passage of scope through the UES even with                            forward viewing endoscope. The [id number]                            ([id number]) Olympus Radial EUS was introduced through                            the mouth after having a wire in place to help                            guide and with significant maneuvering of the                            patient's neck in extension and then extreme                            flexion, and advanced to the duodenum for  ultrasound examination from the stomach and                            duodenum. The upper EUS was technically difficult                            and complex due to region of patient's UES anatomy                            and the location of the lesion. Successful                            completion of the procedure was aided by performing                            the maneuvers documented (below) in this report.                            The patient tolerated the procedure. Technical                            difficulties did not allow all of the EUS images to                            be uploaded into [employer]. Scope In: Scope Out: Findings:      ENDOSCOPIC FINDING: :      One benign-appearing, intrinsic moderate narrowing with subsequent       angulation was found at the cricopharyngeus/UES region extending into       the esophagus 2 cm. This narrowing measured 1.2 cm (inner diameter). The       narrowing was  able to be traversed with the therapeutic endoscope with       significant difficulty. To allow passage of the radial endoscope, a wire       had been placed into the stomach to help with attempt at passage (which       after significant neck extension then flexion and maneuvering slowly was       able to be traversed). However, we attempted to pass the therapeutic       endoscope with our EMR cap in place, but nothing would allow the passage       of the endoscope at that time (1.4 cm with cap in place). No evidence of       tear or perforation noted at completion of procedure.      Normal mucosa was found in the proximal esophagus and in the mid       esophagus.      LA Grade C (one or more mucosal breaks continuous between tops of 2 or       more mucosal folds, less than 75% circumference) esophagitis with no       bleeding was found in the distal esophagus. This looks improved from       other imaging. This region was biopsied.      The Z-line was regular and was found 28 cm from the incisors.      A 7 cm hiatal hernia was present.  Patchy mildly erythematous mucosa without bleeding was found in the       entire examined stomach. No biopsies obtained since patient had biopsies       performed previously.      A single 10 mm submucosal nodule was found in the duodenal bulb. After       completion of EUS preparations were made for mucosal resection. NBI and       White-light imaging was done to mark the borders of the lesion.       Initially I wanted to use a Cap-band lift technique, but the endoscope       with cap could not traverse the cricopharyngeus/UES region. Decision       made to move forward with typical EMR lift technique in effort of       removal of lesion. Orise gel was injected to raise the lesion. After       significant angulation of the endoscope and placement of the patient in       a supine positioning, we maneuvered a snare over the lesion (which had        flattened) and snare mucosal resection was performed. Resection and       retrieval were complete. To prevent bleeding after mucosal resection,       three hemostatic clips were successfully placed (MR conditional). There       was no bleeding during, or at the end, of the procedure.      No gross lesions were noted in the first portion of the duodenum and in       the second portion of the duodenum.      ENDOSONOGRAPHIC FINDING: :      An oval intramural (subepithelial) lesion was found in the duodenal       bulb. The lesion was hypoechoic. Endosonographically, the lesion       appeared to originate from within the deep mucosa (Layer 2). The lesion       measured 10 mm (in maximum thickness). The lesion also measured 5 mm in       diameter. The outer margins were well defined.      No malignant-appearing lymph nodes were visualized in the celiac region       (level 20) and perigastric region.      The celiac region was visualized. Impression:               EGD Impression:                           - Benign-appearing esophageal narrowing noted at                            Cricopharyngeus/UES with angulation.                           - Normal mucosa was found in the proximal esophagus                            and in the mid esophagus.                           - LA Grade C esophagitis with no bleeding distally.                           -  Z-line regular, 28 cm from the incisors.                           - 7 cm hiatal hernia.                           - Erythematous mucosa in the stomach.                           - Submucosal nodule found in the duodenum. After                            EUS, Mucosal resection performed. Clips (MR                            conditional) were placed to decrease risk of                            bleeding.                           - No gross lesions in the first portion of the                            duodenum and in the second portion of the  duodenum.                           EUS Impression:                           - An intramural (subepithelial) lesion was found in                            the duodenal bulb. The lesion appeared to originate                            from within the deep mucosa (Layer 2). A tissue                            diagnosis was obtained prior to this exam. This is                            consistent with a neuroendocrine tumor.                           - No malignant-appearing lymph nodes were                            visualized in the celiac region (level 20) and                            perigastric region. Moderate Sedation:      Not Applicable - Patient had care per Anesthesia. Recommendation:           - The patient will be  observed post-procedure,                            until all discharge criteria are met.                           - Discharge patient to home.                           - Patient has a contact number available for                            emergencies. The signs and symptoms of potential                            delayed complications were discussed with the                            patient. Return to normal activities tomorrow.                            Written discharge instructions were provided to the                            patient.                           - Full liquid diet today. Soft diet tomorrow.                           - PPI twice daily to be continued.                           - Start Carafate liquid 2-4 times daily. At least                            twice daily to aid in healing of the region. Do not                            take any other medication 1 hour before/after the                            Carfate is taken as it can cause issues with                            absorption of other medications.                           - Follow up pathology.                           - Repeat EGD/EUS based on pathology findings.                            - No NSAIDs including aspirin for  3-weeks.                           - Use Sore throat lozenges/spray for next few days.                           - If issues of dysphagia occur consideration of                            bougie/savary dilation.                           - Significant persistent esophagitis is a result of                            her hiatal hernia. Query the role of this being                            worked on in future, if her esophagitis persists as                            she is already on maximal acid suppression via PPI.                            Consideration of H2RA addition at night can be made                            by primary GI.                           - Close monitoring for signs/symptoms of bleeding.                           - Results discussed with patient (she asked for her                            to be only person to know results).                           - This procedure note will be forwarded to her                            primary Gastroenterologist. Procedure Code(s):        --- Professional ---                           [phone], Esophagogastroduodenoscopy, flexible,                            transoral; with endoscopic mucosal resection                           [id number], Esophagogastroduodenoscopy, flexible,  transoral; with endoscopic ultrasound examination                            limited to the esophagus, stomach or duodenum, and                            adjacent structures Diagnosis Code(s):        --- Professional ---                           K22.2, Esophageal obstruction                           K20.90, Esophagitis, unspecified without bleeding                           K44.9, Diaphragmatic hernia without obstruction or                            gangrene                           K31.89, Other diseases of stomach and duodenum                           I89.9, Noninfective  disorder of lymphatic vessels                            and lymph nodes, unspecified CPT copyright 2019 [employer]. All rights reserved. The codes documented in this report are preliminary and upon coder review may  be revised to meet current compliance requirements. Justice Britain, MD 09/30/2019 11:39:23 AM Number of Addenda: 0

## 2019-09-30 NOTE — Progress Notes (Signed)
Explained to pt that no info was given to her friend/ride per her request by Dr. Rush Landmark, by RN Marzetta Board), friend updated given regarding pt still in procedure when calling into unit and called by front desk and updated by D. Mays, Rn, that pt out of procedure

## 2019-10-01 ENCOUNTER — Encounter (HOSPITAL_COMMUNITY): Payer: Self-pay | Admitting: Gastroenterology

## 2019-10-01 DIAGNOSIS — M810 Age-related osteoporosis without current pathological fracture: Secondary | ICD-10-CM | POA: Diagnosis not present

## 2019-10-01 DIAGNOSIS — N3281 Overactive bladder: Secondary | ICD-10-CM | POA: Diagnosis not present

## 2019-10-01 DIAGNOSIS — R5383 Other fatigue: Secondary | ICD-10-CM | POA: Diagnosis not present

## 2019-10-01 DIAGNOSIS — Z0000 Encounter for general adult medical examination without abnormal findings: Secondary | ICD-10-CM | POA: Diagnosis not present

## 2019-10-01 DIAGNOSIS — L509 Urticaria, unspecified: Secondary | ICD-10-CM | POA: Diagnosis not present

## 2019-10-01 DIAGNOSIS — Z23 Encounter for immunization: Secondary | ICD-10-CM | POA: Diagnosis not present

## 2019-10-01 LAB — SURGICAL PATHOLOGY

## 2019-10-04 ENCOUNTER — Telehealth: Payer: Self-pay | Admitting: Gastroenterology

## 2019-10-04 NOTE — Telephone Encounter (Signed)
I called the patient on 10/02/2019 in effort of trying to relay the results of her recent procedure. I left a voicemail. Will try to reach patient early next week with results.  GM

## 2019-10-05 NOTE — Telephone Encounter (Signed)
Still unable to reach patient.  Left message on her cellular/mobile. We will plan to try and reach her tomorrow 10/06/2019.  GM

## 2019-10-07 DIAGNOSIS — N3281 Overactive bladder: Secondary | ICD-10-CM | POA: Diagnosis not present

## 2019-10-07 DIAGNOSIS — Z0000 Encounter for general adult medical examination without abnormal findings: Secondary | ICD-10-CM | POA: Diagnosis not present

## 2019-10-07 DIAGNOSIS — R5383 Other fatigue: Secondary | ICD-10-CM | POA: Diagnosis not present

## 2019-10-07 DIAGNOSIS — M810 Age-related osteoporosis without current pathological fracture: Secondary | ICD-10-CM | POA: Diagnosis not present

## 2019-10-07 DIAGNOSIS — E782 Mixed hyperlipidemia: Secondary | ICD-10-CM | POA: Diagnosis not present

## 2019-10-07 DIAGNOSIS — K5901 Slow transit constipation: Secondary | ICD-10-CM | POA: Diagnosis not present

## 2019-10-07 DIAGNOSIS — G2581 Restless legs syndrome: Secondary | ICD-10-CM | POA: Diagnosis not present

## 2019-10-07 DIAGNOSIS — K2100 Gastro-esophageal reflux disease with esophagitis, without bleeding: Secondary | ICD-10-CM | POA: Diagnosis not present

## 2019-10-08 NOTE — Telephone Encounter (Signed)
Unable to reach patient on 10/6.  Will try again on 10/7.

## 2019-10-10 NOTE — Telephone Encounter (Signed)
Still unable to reach patient on 10/8. If unable to reach early next week, will send results via certified mail.  Justice Britain, MD [facility] # [phone]

## 2019-10-12 NOTE — Telephone Encounter (Signed)
I tried calling the patient again and was unable to reach her.  I will try one more on 10/13/2019.  Otherwise we will send results via certified mail.  I left a message for patient to call back.

## 2019-10-13 ENCOUNTER — Encounter: Payer: Self-pay | Admitting: Gastroenterology

## 2019-10-13 NOTE — Telephone Encounter (Signed)
Was finally able to touch base with the patient this afternoon. We went over the results and a separate result letter will be sent for her records in the mail when that letter is completed. 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 This would be suggestive of the possibility of the lesion having been removed with biopsy versus potential of other tissue still being present. I would not recommend that we not relook at this area for a whole year and thus have recommended that a 3 to 51-month follow-up EGD with potential EMR be performed to relook at the area and remove anything that may be persisting and ensure that carcinoid/neuroendocrine tumor is not still present. Patient is going to continue Carafate to help with healing the EMR site but also in regards to the significant esophagitis that still persists. Patty, please move forward with getting this patient on the list for 8 January/February EGD/EUS 75-minute procedure. FYI Dr. Fuller Plan about plan of action for patient. Thanks to all. GM

## 2019-10-13 NOTE — Telephone Encounter (Signed)
Staff message to call in Jan Feb for procedure

## 2019-10-21 DIAGNOSIS — F3342 Major depressive disorder, recurrent, in full remission: Secondary | ICD-10-CM | POA: Diagnosis not present

## 2019-11-13 ENCOUNTER — Other Ambulatory Visit: Payer: Self-pay | Admitting: Gastroenterology

## 2019-11-16 ENCOUNTER — Other Ambulatory Visit: Payer: Self-pay | Admitting: Internal Medicine

## 2019-11-16 DIAGNOSIS — Z1231 Encounter for screening mammogram for malignant neoplasm of breast: Secondary | ICD-10-CM

## 2019-11-20 DIAGNOSIS — R269 Unspecified abnormalities of gait and mobility: Secondary | ICD-10-CM | POA: Diagnosis not present

## 2019-11-23 DIAGNOSIS — R269 Unspecified abnormalities of gait and mobility: Secondary | ICD-10-CM | POA: Diagnosis not present

## 2019-12-28 ENCOUNTER — Other Ambulatory Visit: Payer: Self-pay

## 2019-12-28 ENCOUNTER — Ambulatory Visit
Admission: RE | Admit: 2019-12-28 | Discharge: 2019-12-28 | Disposition: A | Discharge status: Home / Self Care | Payer: PPO | Source: Ambulatory Visit | Attending: Internal Medicine | Admitting: Internal Medicine

## 2019-12-28 DIAGNOSIS — Z1231 Encounter for screening mammogram for malignant neoplasm of breast: Secondary | ICD-10-CM | POA: Diagnosis not present

## 2019-12-28 DIAGNOSIS — F3342 Major depressive disorder, recurrent, in full remission: Secondary | ICD-10-CM | POA: Diagnosis not present

## 2019-12-30 ENCOUNTER — Other Ambulatory Visit: Payer: Self-pay | Admitting: Internal Medicine

## 2019-12-30 DIAGNOSIS — R928 Other abnormal and inconclusive findings on diagnostic imaging of breast: Secondary | ICD-10-CM

## 2020-01-15 ENCOUNTER — Other Ambulatory Visit: Payer: Self-pay

## 2020-01-15 ENCOUNTER — Other Ambulatory Visit: Payer: Self-pay | Admitting: Internal Medicine

## 2020-01-15 ENCOUNTER — Ambulatory Visit
Admission: RE | Admit: 2020-01-15 | Discharge: 2020-01-15 | Disposition: A | Discharge status: Home / Self Care | Payer: PPO | Source: Ambulatory Visit | Attending: Internal Medicine | Admitting: Internal Medicine

## 2020-01-15 DIAGNOSIS — R928 Other abnormal and inconclusive findings on diagnostic imaging of breast: Secondary | ICD-10-CM | POA: Diagnosis not present

## 2020-01-15 DIAGNOSIS — R921 Mammographic calcification found on diagnostic imaging of breast: Secondary | ICD-10-CM | POA: Diagnosis not present

## 2020-01-15 DIAGNOSIS — R922 Inconclusive mammogram: Secondary | ICD-10-CM | POA: Diagnosis not present

## 2020-01-22 ENCOUNTER — Telehealth: Payer: Self-pay

## 2020-01-22 NOTE — Telephone Encounter (Signed)
Placed a call to the pt to set up EUS 75 min case for carcinoid/neuroendocrine tumor with Dr Rush Landmark.  Left message on machine to call back

## 2020-01-22 NOTE — Telephone Encounter (Signed)
-----   Message from Timothy Lasso, RN sent at 01/13/2020  8:52 AM EST -----  ----- Message ----- From: Timothy Lasso, RN Sent: 01/13/2020  12:00 AM EST To: Timothy Lasso, RN  January/February EGD/EUS 75-minute procedure.

## 2020-01-25 NOTE — Telephone Encounter (Signed)
Placed call to the pt line rings with no voice mail. Will send a letter

## 2020-01-27 ENCOUNTER — Ambulatory Visit
Admission: RE | Admit: 2020-01-27 | Discharge: 2020-01-27 | Disposition: A | Discharge status: Home / Self Care | Payer: PPO | Source: Ambulatory Visit | Attending: Internal Medicine | Admitting: Internal Medicine

## 2020-01-27 ENCOUNTER — Other Ambulatory Visit: Payer: Self-pay | Admitting: Diagnostic Radiology

## 2020-01-27 ENCOUNTER — Other Ambulatory Visit: Payer: Self-pay

## 2020-01-27 DIAGNOSIS — R921 Mammographic calcification found on diagnostic imaging of breast: Secondary | ICD-10-CM | POA: Diagnosis not present

## 2020-01-27 DIAGNOSIS — N6489 Other specified disorders of breast: Secondary | ICD-10-CM | POA: Diagnosis not present

## 2020-03-14 DIAGNOSIS — F3342 Major depressive disorder, recurrent, in full remission: Secondary | ICD-10-CM | POA: Diagnosis not present

## 2020-03-22 ENCOUNTER — Telehealth: Payer: Self-pay | Admitting: Gastroenterology

## 2020-03-23 NOTE — Telephone Encounter (Signed)
Left message for patient to return my call.

## 2020-03-24 NOTE — Telephone Encounter (Signed)
Left message for patient to return my call.

## 2020-03-25 NOTE — Telephone Encounter (Signed)
Left message for patient to return my call. Patient never called back. Will await call back.

## 2020-03-31 DIAGNOSIS — M810 Age-related osteoporosis without current pathological fracture: Secondary | ICD-10-CM | POA: Diagnosis not present

## 2020-03-31 DIAGNOSIS — E782 Mixed hyperlipidemia: Secondary | ICD-10-CM | POA: Diagnosis not present

## 2020-03-31 DIAGNOSIS — G2581 Restless legs syndrome: Secondary | ICD-10-CM | POA: Diagnosis not present

## 2020-03-31 DIAGNOSIS — N3281 Overactive bladder: Secondary | ICD-10-CM | POA: Diagnosis not present

## 2020-04-30 DIAGNOSIS — M810 Age-related osteoporosis without current pathological fracture: Secondary | ICD-10-CM | POA: Diagnosis not present

## 2020-04-30 DIAGNOSIS — G2581 Restless legs syndrome: Secondary | ICD-10-CM | POA: Diagnosis not present

## 2020-04-30 DIAGNOSIS — N3281 Overactive bladder: Secondary | ICD-10-CM | POA: Diagnosis not present

## 2020-04-30 DIAGNOSIS — E782 Mixed hyperlipidemia: Secondary | ICD-10-CM | POA: Diagnosis not present

## 2020-05-02 DIAGNOSIS — F3342 Major depressive disorder, recurrent, in full remission: Secondary | ICD-10-CM | POA: Diagnosis not present

## 2020-06-10 ENCOUNTER — Telehealth: Payer: Self-pay

## 2020-06-10 NOTE — Telephone Encounter (Signed)
Left message on machine to call back  

## 2020-06-10 NOTE — Telephone Encounter (Signed)
-----   Message from Irving Copas., MD sent at 06/10/2020  7:59 AM EDT ----- Regarding: Follow-up Kathleen Vasquez, Can you try to reach out to the patient and see if we can schedule her for another EGD/EUS with EMR 75-minute case)?  I know you reached out earlier this year and could not get a hold of her. You cannot get a hold of her can you send her a letter. Thanks. GM

## 2020-06-13 NOTE — Telephone Encounter (Signed)
Left message on machine to call back  Letter mailed 

## 2020-06-25 ENCOUNTER — Other Ambulatory Visit: Payer: Self-pay | Admitting: Gastroenterology

## 2020-06-25 DIAGNOSIS — K2100 Gastro-esophageal reflux disease with esophagitis, without bleeding: Secondary | ICD-10-CM

## 2020-06-25 DIAGNOSIS — K2270 Barrett's esophagus without dysplasia: Secondary | ICD-10-CM

## 2020-06-30 DIAGNOSIS — N3281 Overactive bladder: Secondary | ICD-10-CM | POA: Diagnosis not present

## 2020-06-30 DIAGNOSIS — E782 Mixed hyperlipidemia: Secondary | ICD-10-CM | POA: Diagnosis not present

## 2020-06-30 DIAGNOSIS — G2581 Restless legs syndrome: Secondary | ICD-10-CM | POA: Diagnosis not present

## 2020-06-30 DIAGNOSIS — M810 Age-related osteoporosis without current pathological fracture: Secondary | ICD-10-CM | POA: Diagnosis not present

## 2020-07-07 DIAGNOSIS — F3342 Major depressive disorder, recurrent, in full remission: Secondary | ICD-10-CM | POA: Diagnosis not present

## 2020-07-23 ENCOUNTER — Other Ambulatory Visit: Payer: Self-pay | Admitting: Gastroenterology

## 2020-07-23 DIAGNOSIS — K2270 Barrett's esophagus without dysplasia: Secondary | ICD-10-CM

## 2020-07-23 DIAGNOSIS — K2100 Gastro-esophageal reflux disease with esophagitis, without bleeding: Secondary | ICD-10-CM

## 2020-08-01 DIAGNOSIS — F3342 Major depressive disorder, recurrent, in full remission: Secondary | ICD-10-CM | POA: Diagnosis not present

## 2020-08-20 ENCOUNTER — Other Ambulatory Visit: Payer: Self-pay | Admitting: Gastroenterology

## 2020-08-20 DIAGNOSIS — K2100 Gastro-esophageal reflux disease with esophagitis, without bleeding: Secondary | ICD-10-CM

## 2020-08-20 DIAGNOSIS — K2270 Barrett's esophagus without dysplasia: Secondary | ICD-10-CM

## 2020-08-31 DIAGNOSIS — E782 Mixed hyperlipidemia: Secondary | ICD-10-CM | POA: Diagnosis not present

## 2020-08-31 DIAGNOSIS — N3281 Overactive bladder: Secondary | ICD-10-CM | POA: Diagnosis not present

## 2020-08-31 DIAGNOSIS — G2581 Restless legs syndrome: Secondary | ICD-10-CM | POA: Diagnosis not present

## 2020-08-31 DIAGNOSIS — M810 Age-related osteoporosis without current pathological fracture: Secondary | ICD-10-CM | POA: Diagnosis not present

## 2020-09-02 ENCOUNTER — Other Ambulatory Visit: Payer: Self-pay | Admitting: Gastroenterology

## 2020-09-02 DIAGNOSIS — K2100 Gastro-esophageal reflux disease with esophagitis, without bleeding: Secondary | ICD-10-CM

## 2020-09-02 DIAGNOSIS — K2270 Barrett's esophagus without dysplasia: Secondary | ICD-10-CM

## 2020-09-15 DIAGNOSIS — F3342 Major depressive disorder, recurrent, in full remission: Secondary | ICD-10-CM | POA: Diagnosis not present

## 2020-09-28 DIAGNOSIS — F3342 Major depressive disorder, recurrent, in full remission: Secondary | ICD-10-CM | POA: Diagnosis not present

## 2020-10-03 ENCOUNTER — Other Ambulatory Visit: Payer: Self-pay | Admitting: Gastroenterology

## 2020-10-03 DIAGNOSIS — K2100 Gastro-esophageal reflux disease with esophagitis, without bleeding: Secondary | ICD-10-CM

## 2020-10-03 DIAGNOSIS — K2270 Barrett's esophagus without dysplasia: Secondary | ICD-10-CM

## 2020-10-12 DIAGNOSIS — Z0000 Encounter for general adult medical examination without abnormal findings: Secondary | ICD-10-CM | POA: Diagnosis not present

## 2020-10-12 DIAGNOSIS — Z23 Encounter for immunization: Secondary | ICD-10-CM | POA: Diagnosis not present

## 2020-10-12 DIAGNOSIS — E782 Mixed hyperlipidemia: Secondary | ICD-10-CM | POA: Diagnosis not present

## 2020-10-12 DIAGNOSIS — R5383 Other fatigue: Secondary | ICD-10-CM | POA: Diagnosis not present

## 2020-10-21 ENCOUNTER — Other Ambulatory Visit: Payer: Self-pay | Admitting: Gastroenterology

## 2020-10-21 DIAGNOSIS — K2100 Gastro-esophageal reflux disease with esophagitis, without bleeding: Secondary | ICD-10-CM

## 2020-10-21 DIAGNOSIS — K2270 Barrett's esophagus without dysplasia: Secondary | ICD-10-CM

## 2020-11-01 DEATH — deceased

## 2021-02-09 ENCOUNTER — Telehealth: Payer: Self-pay

## 2021-02-09 NOTE — Telephone Encounter (Signed)
Noted another letter has been mailed today 02/09/21

## 2021-02-09 NOTE — Telephone Encounter (Signed)
-----   Message from Irving Copas., MD sent at 02/09/2021  2:44 PM EST ----- Regarding: [facility] patient Kathleen Vasquez, We tried to reach out to this patient last year and sent her a letter and left voicemails but she never called back for repeat EUS attempt. I am going to take her off my list unless you end up hearing anything else in the future. Please let me know and she can be set back up for an EUS if she desires. Thanks. GM

## 2021-09-26 IMAGING — MR MR ABDOMEN WO/W CM
17 series · 48 of 48 positions shown · IV contrast (gadavist)
Comparison: CT abdomen pelvis, 05/19/2019

CLINICAL DATA: Evaluate renal lesion, history of duodenal
neuroendocrine tumor

EXAM:
MRI ABDOMEN WITHOUT AND WITH CONTRAST
TECHNIQUE: Multiplanar multisequence MR imaging of the abdomen was performed
both before and after the administration of intravenous contrast.
CONTRAST:  7mL GADAVIST GADOBUTROL 1 MMOL/ML IV SOLN

[Series 2: T2 · coronal · 6.0mm · 1.56mm/px · 1 of 30 slices shown (1 of 2)]
[im 1/30]
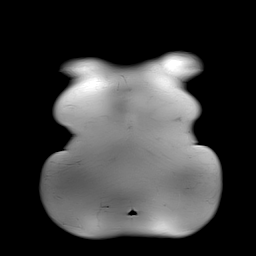

[Series 4: T2 fat-sat · axial · 6.0mm · 1.25mm/px · 1 of 36 slices shown]
[im 1/36]
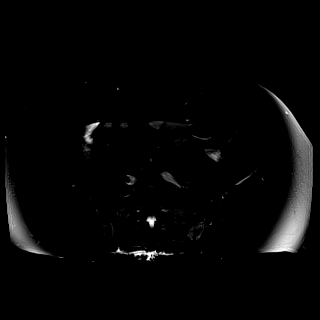

[Series 5: T1 · axial · 3.0mm · 1.25mm/px · z∈[-117,+96]mm · 2 of 72 slices shown (1 of 2)]
[im 1/72]
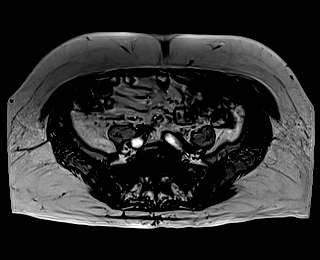
[im 72/72]
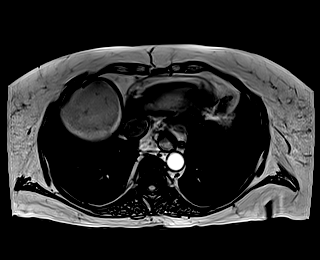

[Series 6: T1 · axial · 3.0mm · 1.25mm/px · z∈[-117,+96]mm · 3 of 72 slices shown (2 of 2)]
[im 1/72]
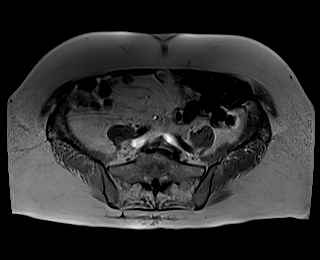
[im 36/72]
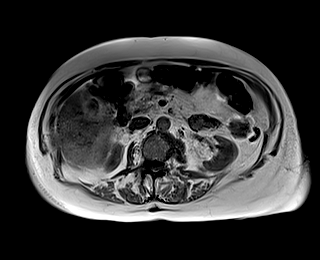
[im 72/72]
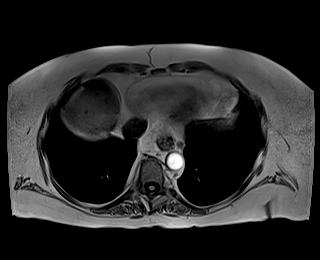

[Series 7: DWI · axial · 6.0mm · 1.49mm/px · z∈[-112,+104]mm · 3 of 62 slices shown (1 of 2)]
[im 1/62]
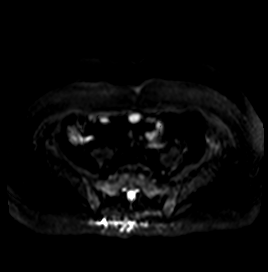
[im 31/62]
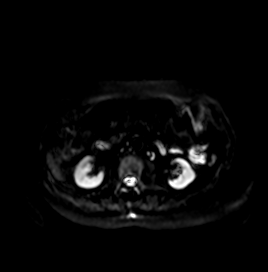
[im 62/62]
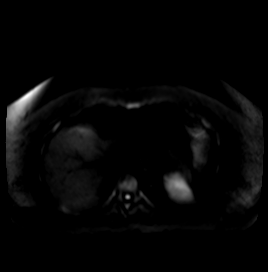

[Series 8: DWI · axial · 6.0mm · 1.49mm/px · 1 of 31 slices shown (2 of 2)]
[im 1/31]
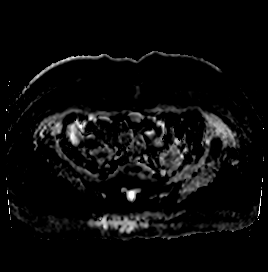

[Series 9: bSSFP · axial · 5.0mm · 0.84mm/px · z∈[-126,+126]mm · 2 of 47 slices shown]
[im 1/47]
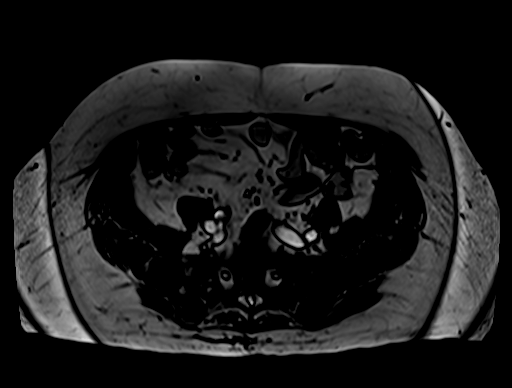
[im 47/47]
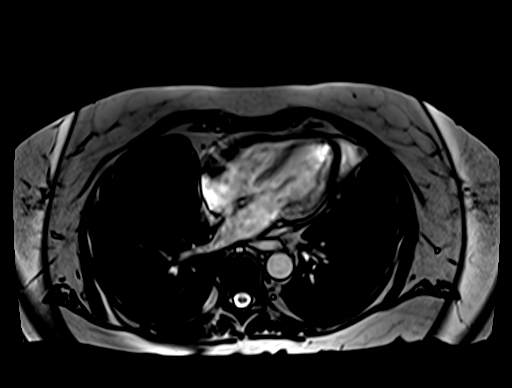

[Series 11: T1 dynamic · axial · 3.0mm · 1.25mm/px · z∈[-122,+115]mm · 4 of 80 slices shown (1 of 5)]
[im 1/80]
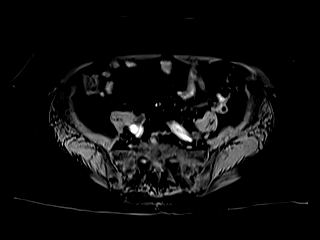
[im 27/80]
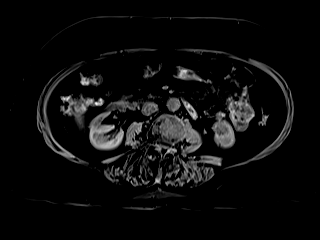
[im 53/80]
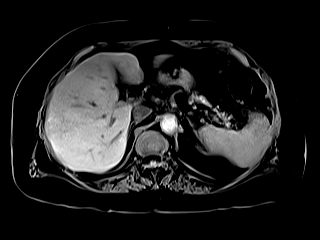
[im 80/80]
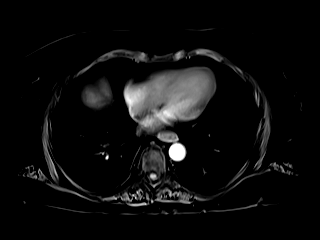

[Series 14: T1 dynamic · axial · 3.0mm · 1.25mm/px · z∈[-122,+115]mm · 4 of 80 slices shown (2 of 5)]
[im 1/80]
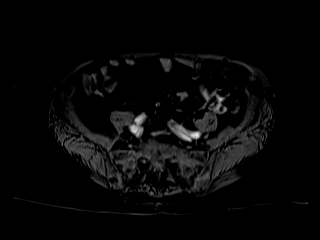
[im 27/80]
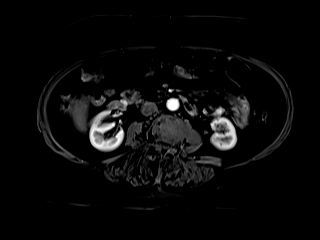
[im 53/80]
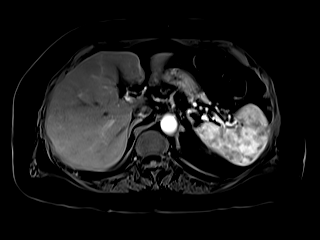
[im 80/80]
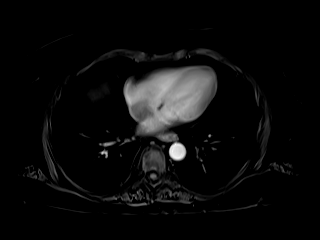

[Series 16: T1 dynamic · axial · 3.0mm · 1.25mm/px · z∈[-122,+115]mm · 4 of 80 slices shown (3 of 5)]
[im 1/80]
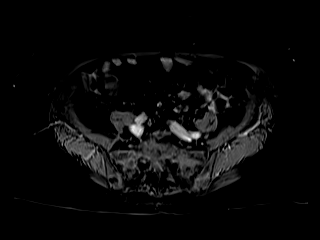
[im 27/80]
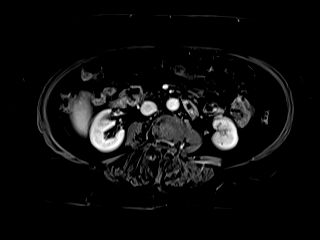
[im 53/80]
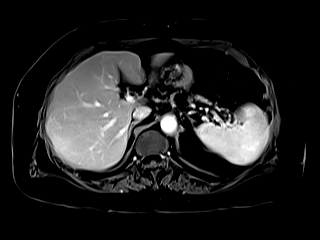
[im 80/80]
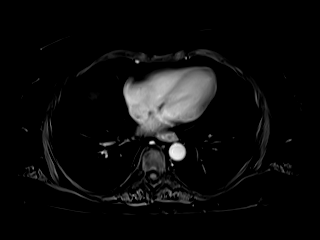

[Series 18: T1 dynamic · axial · 3.0mm · 1.25mm/px · z∈[-122,+115]mm · 4 of 80 slices shown (4 of 5)]
[im 1/80]
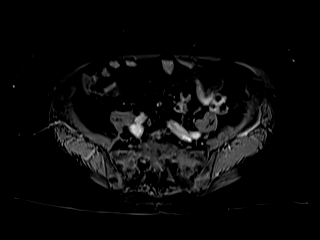
[im 27/80]
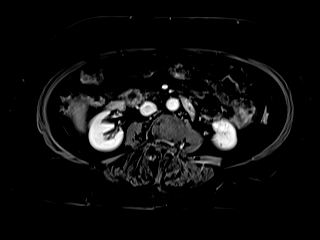
[im 53/80]
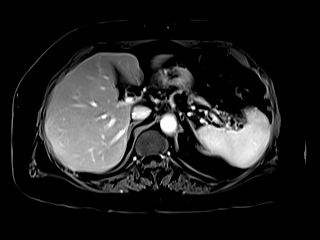
[im 80/80]
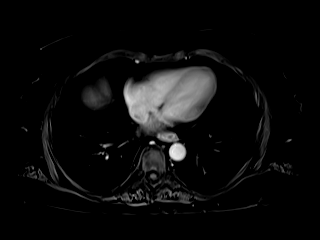

[Series 21: T2 · axial · 6.0mm · 1.56mm/px · 1 of 33 slices shown (2 of 2)]
[im 1/33]
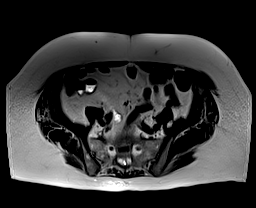

[Series 23: T1 dynamic · axial · 3.0mm · 1.25mm/px · z∈[-122,+115]mm · 4 of 80 slices shown (5 of 5)]
[im 1/80]
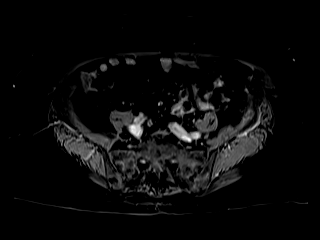
[im 27/80]
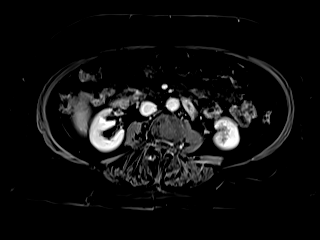
[im 53/80]
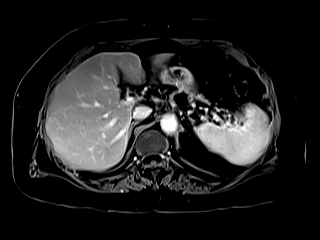
[im 80/80]
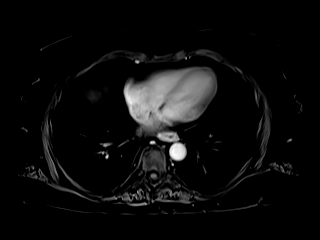

[Series 100: sub 20 sec · axial · 3.0mm · 1.25mm/px · z∈[-122,+115]mm · 4 of 80 slices shown]
[im 1/80]
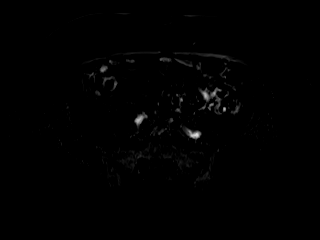
[im 27/80]
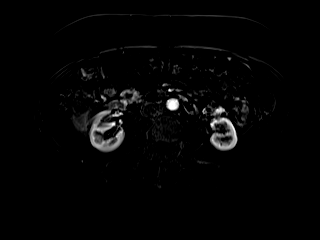
[im 53/80]
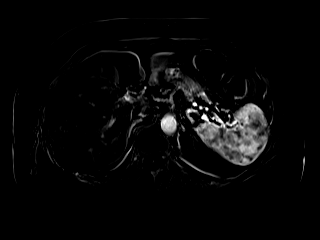
[im 80/80]
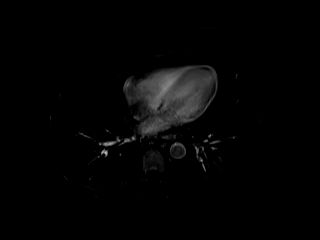

[Series 101: sub 45 sec · axial · 3.0mm · 1.25mm/px · z∈[-122,+115]mm · 4 of 80 slices shown]
[im 1/80]
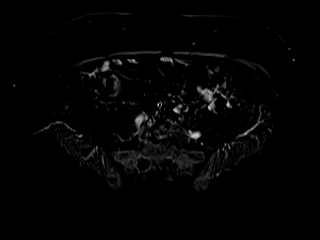
[im 27/80]
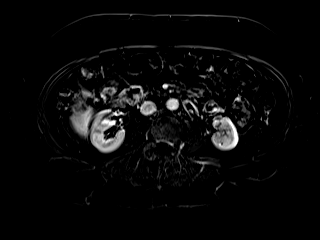
[im 53/80]
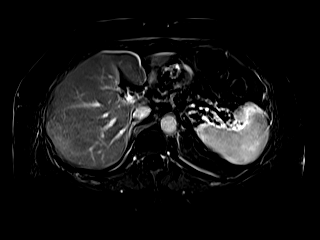
[im 80/80]
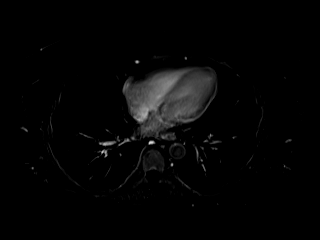

[Series 102: sub 90 sec · axial · 3.0mm · 1.25mm/px · z∈[-122,+115]mm · 4 of 80 slices shown]
[im 1/80]
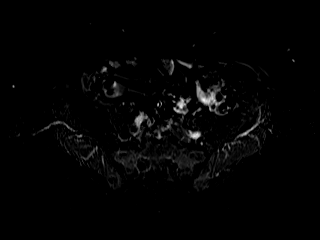
[im 27/80]
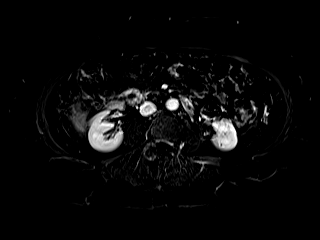
[im 53/80]
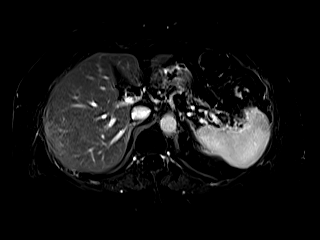
[im 80/80]
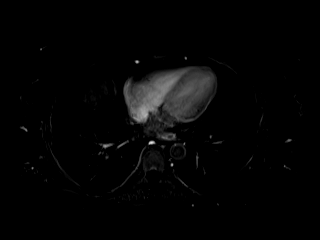

[Series 103: sub 3 min · axial · 3.0mm · 1.25mm/px · z∈[-11,+115]mm · 2 of 43 slices shown]
[im 1/43]
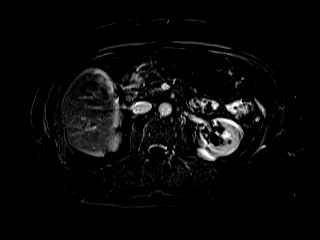
[im 43/43]
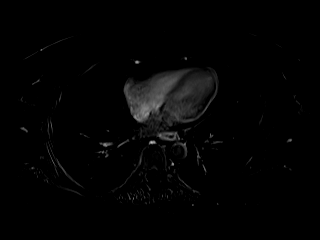

[48 of 48 positions shown; findings below may reference images not displayed]

FINDINGS: Lower chest: No acute findings.  Small hiatal hernia.

Hepatobiliary: No mass or other parenchymal abnormality identified.

Pancreas: No mass, inflammatory changes, or other parenchymal
abnormality identified.

Spleen:  Within normal limits in size and appearance.

Adrenals/Urinary Tract: There is a vividly enhancing partially
exophytic cortical based mass of the posterior midportion of the
left kidney measuring 2.6 x 1.9 cm (series 14, image 40). No
evidence of hydronephrosis.

Stomach/Bowel: Visualized portions within the abdomen are
unremarkable.

Vascular/Lymphatic: No pathologically enlarged lymph nodes
identified. No abdominal aortic aneurysm demonstrated.

Other:  None.

Musculoskeletal: No suspicious bone lesions identified.
IMPRESSION: 1. Vividly enhancing mass of the posterior midportion of the left
kidney measuring 2.6 cm, consistent with renal cell carcinoma. No
evidence of lymphadenopathy, renal vein invasion, or metastatic
disease in the abdomen.

2. No MR evidence of reported duodenal malignancy. As suggested on
prior CT examination, dotatate PET may be helpful for the evaluation
neuroendocrine malignancy in the setting of persistently elevated
tumor markers without conventional imaging findings.

3.  Small hiatal hernia.
